# Patient Record
Sex: Male | Born: 1985 | Race: White | Hispanic: No | Marital: Married | Smoking: Former smoker
Health system: Southern US, Community
[De-identification: ages and names within clinical notes are randomized; demographics above are authoritative.]

## PROBLEM LIST (undated history)

## (undated) DIAGNOSIS — K589 Irritable bowel syndrome without diarrhea: Secondary | ICD-10-CM

## (undated) DIAGNOSIS — F1910 Other psychoactive substance abuse, uncomplicated: Secondary | ICD-10-CM

## (undated) DIAGNOSIS — K859 Acute pancreatitis without necrosis or infection, unspecified: Secondary | ICD-10-CM

## (undated) DIAGNOSIS — M19019 Primary osteoarthritis, unspecified shoulder: Secondary | ICD-10-CM

## (undated) DIAGNOSIS — L709 Acne, unspecified: Secondary | ICD-10-CM

## (undated) DIAGNOSIS — F419 Anxiety disorder, unspecified: Secondary | ICD-10-CM

## (undated) DIAGNOSIS — N200 Calculus of kidney: Secondary | ICD-10-CM

## (undated) HISTORY — DX: Anxiety disorder, unspecified: F41.9

## (undated) HISTORY — DX: Calculus of kidney: N20.0

## (undated) HISTORY — DX: Acne, unspecified: L70.9

## (undated) HISTORY — DX: Irritable bowel syndrome, unspecified: K58.9

## (undated) HISTORY — DX: Other psychoactive substance abuse, uncomplicated: F19.10

## (undated) HISTORY — PX: OPEN ANTERIOR SHOULDER RECONSTRUCTION: SHX2100

## (undated) HISTORY — DX: Primary osteoarthritis, unspecified shoulder: M19.019

## (undated) HISTORY — DX: Acute pancreatitis without necrosis or infection, unspecified: K85.90

## (undated) HISTORY — PX: KNEE ARTHROSCOPY: SUR90

## (undated) HISTORY — PX: NEPHROSTOMY TRACT DILATATION W/ LITHOTRIPSY: SHX2080

---
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
## 2006-09-28 ENCOUNTER — Emergency Department (HOSPITAL_COMMUNITY): Admission: EM | Admit: 2006-09-28 | Discharge: 2006-09-28 | Payer: Self-pay | Admitting: Emergency Medicine

## 2006-09-28 ENCOUNTER — Ambulatory Visit: Payer: Self-pay | Admitting: Internal Medicine

## 2006-09-28 ENCOUNTER — Encounter: Payer: Self-pay | Admitting: Internal Medicine

## 2006-09-30 ENCOUNTER — Ambulatory Visit: Payer: Self-pay | Admitting: Internal Medicine

## 2006-10-02 ENCOUNTER — Ambulatory Visit (HOSPITAL_COMMUNITY): Admission: RE | Admit: 2006-10-02 | Discharge: 2006-10-02 | Payer: Self-pay | Admitting: Internal Medicine

## 2006-10-02 ENCOUNTER — Encounter: Payer: Self-pay | Admitting: Internal Medicine

## 2006-10-07 ENCOUNTER — Ambulatory Visit: Payer: Self-pay | Admitting: Internal Medicine

## 2007-01-21 ENCOUNTER — Ambulatory Visit: Payer: Self-pay | Admitting: Internal Medicine

## 2007-02-24 ENCOUNTER — Ambulatory Visit: Payer: Self-pay | Admitting: Internal Medicine

## 2007-04-05 ENCOUNTER — Ambulatory Visit: Payer: Self-pay | Admitting: Internal Medicine

## 2007-04-05 LAB — CONVERTED CEMR LAB
AST: 18 units/L (ref 0–37)
Alkaline Phosphatase: 64 units/L (ref 39–117)
CO2: 32 meq/L (ref 19–32)
Calcium: 10 mg/dL (ref 8.4–10.5)
Eosinophils Relative: 1.1 % (ref 0.0–5.0)
GFR calc non Af Amer: 91 mL/min
Glucose, Bld: 87 mg/dL (ref 70–99)
Hemoglobin: 15.7 g/dL (ref 13.0–17.0)
Lymphocytes Relative: 28.5 % (ref 12.0–46.0)
Monocytes Relative: 7.2 % (ref 3.0–11.0)
Neutro Abs: 5.5 10*3/uL (ref 1.4–7.7)
Platelets: 225 10*3/uL (ref 150–400)
Potassium: 4.3 meq/L (ref 3.5–5.1)
RDW: 11.8 % (ref 11.5–14.6)
Sodium: 143 meq/L (ref 135–145)
Total Protein: 7.2 g/dL (ref 6.0–8.3)
WBC: 8.8 10*3/uL (ref 4.5–10.5)

## 2007-04-12 ENCOUNTER — Ambulatory Visit (HOSPITAL_COMMUNITY): Admission: RE | Admit: 2007-04-12 | Discharge: 2007-04-12 | Payer: Self-pay | Admitting: Urology

## 2007-05-22 ENCOUNTER — Encounter: Payer: Self-pay | Admitting: *Deleted

## 2007-06-08 ENCOUNTER — Ambulatory Visit: Payer: Self-pay | Admitting: Internal Medicine

## 2007-11-02 ENCOUNTER — Ambulatory Visit: Payer: Self-pay | Admitting: Internal Medicine

## 2008-01-03 DIAGNOSIS — N200 Calculus of kidney: Secondary | ICD-10-CM | POA: Insufficient documentation

## 2008-01-03 DIAGNOSIS — K589 Irritable bowel syndrome without diarrhea: Secondary | ICD-10-CM | POA: Insufficient documentation

## 2008-01-03 DIAGNOSIS — K219 Gastro-esophageal reflux disease without esophagitis: Secondary | ICD-10-CM | POA: Insufficient documentation

## 2008-03-10 ENCOUNTER — Ambulatory Visit: Payer: Self-pay | Admitting: Internal Medicine

## 2008-03-10 DIAGNOSIS — R1013 Epigastric pain: Secondary | ICD-10-CM | POA: Insufficient documentation

## 2008-03-10 DIAGNOSIS — R197 Diarrhea, unspecified: Secondary | ICD-10-CM | POA: Insufficient documentation

## 2008-03-13 ENCOUNTER — Encounter (INDEPENDENT_AMBULATORY_CARE_PROVIDER_SITE_OTHER): Payer: Self-pay

## 2008-03-13 LAB — CONVERTED CEMR LAB
Amylase: 47 units/L (ref 27–131)
BUN: 11 mg/dL (ref 6–23)
Basophils Absolute: 0 10*3/uL (ref 0.0–0.1)
CRP, High Sensitivity: <1 — ABNORMAL LOW (ref 0.00–5.00)
Chloride: 103 meq/L (ref 96–112)
Eosinophils Absolute: 0.2 10*3/uL (ref 0.0–0.7)
GFR calc Af Amer: 110 mL/min
Glucose, Bld: 67 mg/dL — ABNORMAL LOW (ref 70–99)
Lipase: 22 units/L (ref 11.0–59.0)
Lymphocytes Relative: 33.6 % (ref 12.0–46.0)
Monocytes Relative: 5.5 % (ref 3.0–12.0)
Neutro Abs: 4.5 10*3/uL (ref 1.4–7.7)
Neutrophils Relative %: 58.3 % (ref 43.0–77.0)
Sodium: 142 meq/L (ref 135–145)

## 2008-04-18 ENCOUNTER — Ambulatory Visit: Payer: Self-pay | Admitting: Internal Medicine

## 2008-04-18 LAB — HM COLONOSCOPY

## 2008-04-19 ENCOUNTER — Telehealth: Payer: Self-pay | Admitting: Internal Medicine

## 2008-06-24 ENCOUNTER — Emergency Department (HOSPITAL_COMMUNITY): Admission: EM | Admit: 2008-06-24 | Discharge: 2008-06-25 | Payer: Self-pay | Admitting: Emergency Medicine

## 2008-06-26 ENCOUNTER — Telehealth (INDEPENDENT_AMBULATORY_CARE_PROVIDER_SITE_OTHER): Payer: Self-pay | Admitting: *Deleted

## 2008-06-26 ENCOUNTER — Ambulatory Visit: Payer: Self-pay | Admitting: Internal Medicine

## 2008-06-26 DIAGNOSIS — R7401 Elevation of levels of liver transaminase levels: Secondary | ICD-10-CM | POA: Insufficient documentation

## 2008-06-26 DIAGNOSIS — R740 Nonspecific elevation of levels of transaminase and lactic acid dehydrogenase [LDH]: Secondary | ICD-10-CM

## 2008-06-27 ENCOUNTER — Encounter: Payer: Self-pay | Admitting: Internal Medicine

## 2008-07-04 ENCOUNTER — Ambulatory Visit (HOSPITAL_COMMUNITY): Admission: RE | Admit: 2008-07-04 | Discharge: 2008-07-04 | Payer: Self-pay | Admitting: Internal Medicine

## 2008-07-11 ENCOUNTER — Telehealth: Payer: Self-pay | Admitting: Internal Medicine

## 2008-07-18 ENCOUNTER — Encounter: Payer: Self-pay | Admitting: Internal Medicine

## 2008-08-09 ENCOUNTER — Ambulatory Visit (HOSPITAL_COMMUNITY): Admission: RE | Admit: 2008-08-09 | Discharge: 2008-08-09 | Payer: Self-pay | Admitting: General Surgery

## 2008-08-09 ENCOUNTER — Encounter: Payer: Self-pay | Admitting: Internal Medicine

## 2008-10-13 ENCOUNTER — Ambulatory Visit: Payer: Self-pay | Admitting: Internal Medicine

## 2008-10-13 LAB — CONVERTED CEMR LAB
AST: 21 units/L (ref 0–37)
Alkaline Phosphatase: 43 units/L (ref 39–117)
IgA: 188 mg/dL (ref 68–378)
Lipase: 27 units/L (ref 11.0–59.0)
Tissue Transglutaminase Ab, IgA: 0.3 units (ref <7)
Total Bilirubin: 0.8 mg/dL (ref 0.3–1.2)

## 2008-10-18 ENCOUNTER — Telehealth: Payer: Self-pay | Admitting: Internal Medicine

## 2008-10-18 ENCOUNTER — Ambulatory Visit: Payer: Self-pay | Admitting: Internal Medicine

## 2008-10-29 ENCOUNTER — Encounter: Payer: Self-pay | Admitting: Internal Medicine

## 2008-10-30 ENCOUNTER — Telehealth: Payer: Self-pay | Admitting: Internal Medicine

## 2008-10-31 ENCOUNTER — Encounter: Payer: Self-pay | Admitting: Gastroenterology

## 2008-10-31 ENCOUNTER — Encounter: Payer: Self-pay | Admitting: Internal Medicine

## 2008-11-13 ENCOUNTER — Telehealth: Payer: Self-pay | Admitting: Internal Medicine

## 2008-11-14 ENCOUNTER — Telehealth: Payer: Self-pay | Admitting: Physician Assistant

## 2008-11-16 ENCOUNTER — Ambulatory Visit (HOSPITAL_COMMUNITY): Admission: RE | Admit: 2008-11-16 | Discharge: 2008-11-16 | Payer: Self-pay | Admitting: Gastroenterology

## 2008-11-16 ENCOUNTER — Ambulatory Visit: Payer: Self-pay | Admitting: Gastroenterology

## 2008-11-27 ENCOUNTER — Telehealth: Payer: Self-pay | Admitting: Internal Medicine

## 2008-11-27 ENCOUNTER — Encounter (INDEPENDENT_AMBULATORY_CARE_PROVIDER_SITE_OTHER): Payer: Self-pay

## 2008-12-11 ENCOUNTER — Ambulatory Visit: Payer: Self-pay | Admitting: Internal Medicine

## 2008-12-19 ENCOUNTER — Encounter: Payer: Self-pay | Admitting: Cardiology

## 2008-12-19 ENCOUNTER — Ambulatory Visit: Payer: Self-pay | Admitting: Cardiology

## 2009-01-23 ENCOUNTER — Telehealth: Payer: Self-pay | Admitting: Internal Medicine

## 2009-03-16 ENCOUNTER — Telehealth: Payer: Self-pay | Admitting: Internal Medicine

## 2009-03-19 ENCOUNTER — Encounter: Payer: Self-pay | Admitting: Internal Medicine

## 2009-04-19 ENCOUNTER — Telehealth: Payer: Self-pay | Admitting: Internal Medicine

## 2009-07-18 ENCOUNTER — Telehealth (INDEPENDENT_AMBULATORY_CARE_PROVIDER_SITE_OTHER): Payer: Self-pay | Admitting: *Deleted

## 2009-07-19 ENCOUNTER — Telehealth: Payer: Self-pay | Admitting: Internal Medicine

## 2009-07-22 ENCOUNTER — Encounter: Payer: Self-pay | Admitting: Internal Medicine

## 2009-10-30 ENCOUNTER — Ambulatory Visit: Payer: Self-pay | Admitting: Internal Medicine

## 2009-10-30 DIAGNOSIS — R030 Elevated blood-pressure reading, without diagnosis of hypertension: Secondary | ICD-10-CM | POA: Insufficient documentation

## 2009-10-30 DIAGNOSIS — R635 Abnormal weight gain: Secondary | ICD-10-CM | POA: Insufficient documentation

## 2009-11-01 ENCOUNTER — Ambulatory Visit: Payer: Self-pay | Admitting: Internal Medicine

## 2009-11-23 ENCOUNTER — Ambulatory Visit: Payer: Self-pay | Admitting: Internal Medicine

## 2009-12-08 ENCOUNTER — Emergency Department (HOSPITAL_BASED_OUTPATIENT_CLINIC_OR_DEPARTMENT_OTHER): Admission: EM | Admit: 2009-12-08 | Discharge: 2009-12-08 | Payer: Self-pay | Admitting: Emergency Medicine

## 2009-12-08 ENCOUNTER — Ambulatory Visit: Payer: Self-pay | Admitting: Diagnostic Radiology

## 2009-12-28 ENCOUNTER — Telehealth: Payer: Self-pay | Admitting: Internal Medicine

## 2009-12-28 ENCOUNTER — Encounter: Payer: Self-pay | Admitting: Internal Medicine

## 2010-02-12 HISTORY — PX: SHOULDER SURGERY: SHX246

## 2010-04-12 ENCOUNTER — Ambulatory Visit: Payer: Self-pay | Admitting: Internal Medicine

## 2010-04-12 DIAGNOSIS — K5289 Other specified noninfective gastroenteritis and colitis: Secondary | ICD-10-CM | POA: Insufficient documentation

## 2010-04-12 LAB — CONVERTED CEMR LAB
Alkaline Phosphatase: 47 units/L (ref 39–117)
Basophils Absolute: 0 10*3/uL (ref 0.0–0.1)
CO2: 26 meq/L (ref 19–32)
Calcium: 10 mg/dL (ref 8.4–10.5)
Chloride: 110 meq/L (ref 96–112)
GFR calc non Af Amer: 95.05 mL/min (ref >60)
HCT: 42.8 % (ref 39.0–52.0)
Hemoglobin: 14.9 g/dL (ref 13.0–17.0)
Lymphocytes Relative: 22.9 % (ref 12.0–46.0)
Lymphs Abs: 1.9 10*3/uL (ref 0.7–4.0)
Potassium: 4.3 meq/L (ref 3.5–5.1)
RBC: 4.58 M/uL (ref 4.22–5.81)
Total Bilirubin: 0.6 mg/dL (ref 0.3–1.2)
Total Protein: 7.8 g/dL (ref 6.0–8.3)

## 2010-04-18 ENCOUNTER — Ambulatory Visit: Payer: Self-pay | Admitting: Internal Medicine

## 2010-04-18 ENCOUNTER — Telehealth: Payer: Self-pay | Admitting: Internal Medicine

## 2010-04-19 ENCOUNTER — Ambulatory Visit: Payer: Self-pay | Admitting: Internal Medicine

## 2010-04-22 ENCOUNTER — Telehealth: Payer: Self-pay | Admitting: Internal Medicine

## 2010-05-09 ENCOUNTER — Telehealth: Payer: Self-pay | Admitting: Internal Medicine

## 2010-05-10 ENCOUNTER — Ambulatory Visit: Payer: Self-pay | Admitting: Gastroenterology

## 2010-05-10 DIAGNOSIS — R109 Unspecified abdominal pain: Secondary | ICD-10-CM | POA: Insufficient documentation

## 2010-05-10 DIAGNOSIS — K6289 Other specified diseases of anus and rectum: Secondary | ICD-10-CM | POA: Insufficient documentation

## 2010-05-10 DIAGNOSIS — K625 Hemorrhage of anus and rectum: Secondary | ICD-10-CM | POA: Insufficient documentation

## 2010-05-10 DIAGNOSIS — R634 Abnormal weight loss: Secondary | ICD-10-CM | POA: Insufficient documentation

## 2010-05-13 ENCOUNTER — Ambulatory Visit: Payer: Self-pay | Admitting: Cardiovascular Disease

## 2010-05-15 LAB — CONVERTED CEMR LAB
BUN: 12 mg/dL (ref 6–23)
Basophils Relative: 0.8 % (ref 0.0–3.0)
CRP, High Sensitivity: 1.76 (ref 0.00–5.00)
Creatinine, Ser: 0.9 mg/dL (ref 0.4–1.5)
Eosinophils Relative: 3 % (ref 0.0–5.0)
GFR calc non Af Amer: 113.91 mL/min (ref >60)
Glucose, Bld: 88 mg/dL (ref 70–99)
HCT: 42.2 % (ref 39.0–52.0)
Hemoglobin: 14.8 g/dL (ref 13.0–17.0)
Lymphocytes Relative: 36.1 % (ref 12.0–46.0)
RBC: 4.49 M/uL (ref 4.22–5.81)
RDW: 12.4 % (ref 11.5–14.6)
Sed Rate: 6 mm/hr (ref 0–22)
Sodium: 143 meq/L (ref 135–145)
WBC: 7.2 10*3/uL (ref 4.5–10.5)

## 2010-07-30 ENCOUNTER — Telehealth: Payer: Self-pay | Admitting: Internal Medicine

## 2010-09-20 ENCOUNTER — Encounter: Payer: Self-pay | Admitting: Internal Medicine

## 2010-09-26 ENCOUNTER — Encounter: Payer: Self-pay | Admitting: Internal Medicine

## 2010-11-05 NOTE — Assessment & Plan Note (Signed)
Summary: FOLLOW UP MEDS--CH.    History of Present Illness Visit Type: Follow-up Visit Primary GI MD: Stan Head MD [facility] Primary Provider: Illene Regulus, M.D. Requesting Provider: NA Chief Complaint: IBS, abdominal pain and ? medication side effects History of Present Illness:   Patinet has alot of questions about the side effects of the Nortriptyline. He also would like to find an alt. drug that does the same thing because he wants to get into law enforcement and can not be on any antidepressants. He is also having some epigastric pain. The side effects from the medication include rapid heart reate, weight gain, and he feels more aggresive.   Epigastric pain this week. feels good and then bad at times. taking daily Advil for upper abdominal tightness, not as severe as in past (this is different since nortriptylline)  BP taken at home 130-140/90-100 for diastolic Resting pulse 80-85. Pulse up to 155 when exercising. No chest pain or pre-syncope described.  Never took nortriptylline for 50 mg   GI Review of Systems    Reports abdominal pain.     Location of  Abdominal pain: epigastric area.    Denies acid reflux, belching, bloating, chest pain, dysphagia with liquids, dysphagia with solids, heartburn, loss of appetite, nausea, vomiting, vomiting blood, weight loss, and  weight gain.        Denies anal fissure, black tarry stools, change in bowel habit, constipation, diarrhea, diverticulosis, fecal incontinence, heme positive stool, hemorrhoids, irritable bowel syndrome, jaundice, light color stool, liver problems, rectal bleeding, and  rectal pain. Preventive Screening-Counseling & Management  Alcohol-Tobacco     Smoking Status: quit     Smoking Cessation Counseling: yes    Current Medications (verified): 1)  Nortriptyline Hcl 25 Mg Caps (Nortriptyline Hcl) .... Take One By Mouth At Bedtime 2)  Bentyl 20 Mg Tabs (Dicyclomine Hcl) .... Take One By Mouth Once Daily  Allergies  (verified): 1)  ! * Cefzil 2)  Morphine Sulfate (Morphine Sulfate)  Past History:  Past Surgical History: Last updated: 12/11/2008 reconstruction right shoulder for subluxation  Dr. Eulah Pont lithotripsy for nephrolithiasis Knee Arthroscopy(rt)  Family History: Last updated: 03/10/2008  Neg- colon cancer, CAD, DM Family History of Pancreatic Cancer: Family History of Colitis/Crohn's:mother  Social History: Last updated: 10/30/2009 HSG some college work, in school at [employer], working towards Patent examiner unemployed since 8/10, living off financial aid Alcohol Use - no Daily Caffeine Use Patient is a former smoker, age [age], quit 2010 Dips snuff inermittently Patient has been counseled to quit all tobacco  Social History: HSG some college work, in school at [employer], working towards Patent examiner unemployed since 8/10, living off financial aid Alcohol Use - no Daily Caffeine Use Patient is a former smoker, age [age], quit 2010 Dips snuff inermittently Patient has been counseled to quit all tobacco Smoking Status:  quit  Review of Systems       The patient complains of arthritis/joint pain, fatigue, headaches-new, muscle pains/cramps, shortness of breath, sleeping problems, and thirst - excessive.    Vital Signs:  Patient profile:   25 year old male Height:      70 inches Weight:      213 pounds BMI:     30.67 Pulse rate:   104 / minute Pulse rhythm:   regular BP sitting:   130 / 90  (left arm) Cuff size:   regular  Vitals Entered By: Harlow Mares CMA Duncan Dull) (October 30, 2009 11:00 AM)  Physical Exam  General:  Well developed, well nourished, no acute distress. overweight Eyes:  anicteric Lungs:  Clear throughout to auscultation. Heart:  Regular rate and rhythm; no murmurs, rubs,  or bruits. Abdomen:  mildly tender epigastrium, worse with muscle tenson otherwise sft and nontender w/o HSM, mass Psych:  Alert and cooperative. Normal mood  and affect.   Impression & Recommendations:  Problem # 1:  IBS (ICD-564.1) Assessment Improved this has responded well to nortriptylline it seems but ? side effects Overall the best he has been in some time so not sure what therapy I would offer if he stops TCA will ask him to see Dr. Debby Bud to evaluate re: possible nortriptylline side effects he maty benefit from tertiary center IBS evaluation also await Dr. Debby Bud' assessment before changes  Problem # 2:  EPIGASTRIC PAIN (ICD-789.06) Assessment: Deteriorated ? musculoskeletal advised to stop sit ups and will see how he is  Problem # 3:  INCREASED BLOOD PRESSURE (ICD-796.2) Assessment: New ? wgt increase, meds causing he will see Dr. Debby Bud I advised him to record BP's for Dr. Debby Bud to see  Problem # 4:  ENCOUNTER FOR LONG-TERM USE TRICYCLICS (ICD-V58.69) Assessment: Comment Only ? side effects of this vs. weight gain from smoking cessation and increased weight leading to BP increase, tachycardia with exercise, etc he has gained 40# since 3/10  Problem # 5:  WEIGHT GAIN (ICD-783.1) Assessment: New 40# increase since 3/10 he has stopped smoking and been on nortriptylline  Patient Instructions: 1)  Please stop any exercises that strain the abdominal wall. 2)  Please schedule a follow up with Dr. Debby Bud to discuss your elevated blood pressure and possible medication side effects. 3)  Copy sent to : Illene Regulus, MD 4)  The medication list was reviewed and reconciled.  All changed / newly prescribed medications were explained.  A complete medication list was provided to the patient / caregiver.

## 2010-11-05 NOTE — Letter (Signed)
Summary: Email from patient/[facility]  Email from patient/[facility]   Imported By: Lester Bristol 01/30/2010 09:54:14  _____________________________________________________________________  External Attachment:    Type:   Image     Comment:   External Document

## 2010-11-05 NOTE — Progress Notes (Signed)
  Phone Note Call from Patient Call back at [facility] Phone [phone]   Summary of Call: Patient is requesting rx for gen zoloft 50mg . OK?  Initial call taken by: Lamar Sprinkles, CMA,  July 30, 2010 9:40 AM  Follow-up for Phone Call        OK for sertraline 50,g  sig 1 by mouth once daily , #30  refill as needed  Follow-up by: Jacques Navy MD,  July 30, 2010 12:45 PM  Additional Follow-up for Phone Call Additional follow up Details #1::        Patient notified and rx sent.Alvy Beal Archie CMA  July 30, 2010 2:11 PM     New/Updated Medications: SERTRALINE HCL 50 MG TABS (SERTRALINE HCL) Take 1 tablet by mouth once a day Prescriptions: SERTRALINE HCL 50 MG TABS (SERTRALINE HCL) Take 1 tablet by mouth once a day  #30 x 11   Entered by:   Rock Nephew CMA   Authorized by:   Jacques Navy MD   Signed by:   Rock Nephew CMA on 07/30/2010   Method used:   Electronically to        [id number]* (retail)       [address]       [place], [place]  [address]       Ph: [phone]       Fax: [phone]   RxID:   [id number]

## 2010-11-05 NOTE — Progress Notes (Signed)
  Phone Note Outgoing Call   Reason for Call: Discuss lab or test results Summary of Call: Please call patient - stools studies were negative. None the less complete the flagy prescription  THNAKS Initial call taken by: Jacques Navy MD,  April 22, 2010 4:53 AM  Follow-up for Phone Call        spoke with pt and informed him. He states he is feeling much better, but has developed some blood in his urine. what do you advise for pt? Follow-up by: Ami Bullins CMA,  April 22, 2010 11:19 AM  Additional Follow-up for Phone Call Additional follow up Details #1::        con't flagyl. If he continues to have blood in the urine he will need f/u OV Additional Follow-up by: Jacques Navy MD,  April 22, 2010 11:58 AM    Additional Follow-up for Phone Call Additional follow up Details #2::    informed pt Follow-up by: Ami Bullins CMA,  April 22, 2010 2:11 PM

## 2010-11-05 NOTE — Progress Notes (Signed)
Summary: DIARRHEA  Phone Note Call from Patient   Summary of Call: Pt was seen by Dr Jonny Ruiz for stomach "virus". Feels better this week but continues to c/o diarrhea daily. The lomotil has not help at all w/diarrhea. left mess to call office back for more details Initial call taken by: Lamar Sprinkles, CMA,  April 18, 2010 3:00 PM  Follow-up for Phone Call        Spoke w/pt, Lomotil gave some relief this weekend. Starting monday pt has had 5 to 6 loose watery stools. Thinks the red he sees is from hemorrhoids and not in the stool itself. No fever but pt consistently has been taking tylenol. Lomotil has not given relief.  MD informed, office visit and stool studies needed. Scheduled for tomorrow, lab order in [facility] Follow-up by: Lamar Sprinkles, CMA,  April 18, 2010 3:15 PM

## 2010-11-05 NOTE — Assessment & Plan Note (Signed)
Summary: DISCUSS NEW RX WITH MD-LB   Vital Signs:  Patient profile:   25 year old male Height:      70 inches Weight:      210 pounds BMI:     30.24 O2 Sat:      98 % on Room air Temp:     98.2 degrees F oral Pulse rate:   76 / minute BP sitting:   128 / 82  (left arm) Cuff size:   regular  Vitals Entered By: Bill Salinas CMA (November 23, 2009 4:48 PM)  O2 Flow:  Room air CC: pt here to discuss buspirone/ ab   Primary Care Provider:  Illene Regulus, M.D.  CC:  pt here to discuss buspirone/ ab.  History of Present Illness: Patient with severe IBS who has had trouble with adjunct medications: intolerant of desipramine, intolerant of wellbutrin though it helped the IBS. HE has tried several SSRI products in the past to which he is intolerant.   Current Medications (verified): 1)  Bentyl 20 Mg Tabs (Dicyclomine Hcl) .... Take One By Mouth Once Daily 2)  Buspirone Hcl 15 Mg Tabs (Buspirone Hcl) .... 1/2 Tab Two Times A Day X 5, 2/3 Tab Two Times A Day X 5 Days Then 1 Tab Bid  Allergies (verified): 1)  ! * Cefzil 2)  Morphine Sulfate (Morphine Sulfate) PMH-FH-SH reviewed-no changes except otherwise noted  Review of Systems  The patient denies anorexia, fever, weight loss, weight gain, vision loss, decreased hearing, hoarseness, chest pain, syncope, dyspnea on exertion, peripheral edema, prolonged cough, headaches, hemoptysis, melena, hematochezia, severe indigestion/heartburn, hematuria, incontinence, genital sores, muscle weakness, suspicious skin lesions, transient blindness, difficulty walking, depression, unusual weight change, abnormal bleeding, enlarged lymph nodes, angioedema, and testicular masses.    Physical Exam  General:  Well-developed,well-nourished,in no acute distress; alert,appropriate and cooperative throughout examination Lungs:  normal respiratory effort.   Heart:  normal rate and regular rhythm.   Psych:  Oriented X3, memory intact for recent and remote,  normally interactive, and good eye contact.     Impression & Recommendations:  Problem # 1:  IBS (ICD-564.1) persistent IBS. Will now try benzodiazepine as adjunct having been intolerant of many products. Did discuss the risks of habituation, somnolence with chronic use of this class of drug.  Plan- lorazepam 0.5 mg two times a day or three times a day for IBS symptoms.  Complete Medication List: 1)  Bentyl 20 Mg Tabs (Dicyclomine hcl) .... Take one by mouth once daily 2)  Lorazepam 0.5 Mg Tabs (Lorazepam) .Marland Kitchen.. 1 by mouth two times a day or three times a day for anxiety Prescriptions: LORAZEPAM 0.5 MG TABS (LORAZEPAM) 1 by mouth two times a day or three times a day for anxiety  #90 x 1   Entered and Authorized by:   Jacques Navy MD   Signed by:   Jacques Navy MD on 11/23/2009   Method used:   Print then Give to Patient   RxID:   [id number]

## 2010-11-05 NOTE — Procedures (Signed)
Summary: [facility] COLON   Colonoscopy  Procedure date:  04/18/2008  Findings:      Location:  [facility].   Patient Name: Ronald Daniel, Ronald Daniel. MRN: [id number] Procedure Procedures: Colonoscopy CPT: [phone].  Personnel: Endoscopist: Iva Boop, MD, [facility].  Exam Location: Exam performed in Outpatient Clinic. Outpatient  Patient Consent: Procedure, Alternatives, Risks and Benefits discussed, consent obtained, from patient. Consent was obtained by the RN.  Indications Symptoms: Diarrhea Abdominal pain / bloating. Rectal bleeding.  History  Current Medications: Patient is not currently taking Coumadin.  Allergies: Allergic to Cefzime, Morphine.  Comments: Diarrhea and abdomnal pain (intermittent), some rectal bleeding. Mom has Crohn's. Recent CBC, CMET, amylase/lipase ok Pre-Exam Physical: Performed Apr 18, 2008. Cardio-pulmonary exam, Rectal exam, HEENT exam , Abdominal exam, Mental status exam WNL.  Comments: Pt. history reviewed/updated, physical exam performed prior to initiation of sedation? YES Exam Exam: Extent of exam reached: Cecum, extent intended: Cecum.  The cecum was identified by appendiceal orifice and IC valve. Patient position: on left side. Time to Cecum: 00:02:34. Time for Withdrawl: 00:08:04. Colon retroflexion performed. Images taken. ASA Classification: II. Tolerance: fair, adequate exam.  Monitoring: Pulse and BP monitoring, Oximetry used. Supplemental O2 given.  Colon Prep Used MiraLax for colon prep. Prep results: good.  Sedation Meds: Patient assessed and found to be appropriate for moderate (conscious) sedation. Fentanyl 75 mcg. given IV. Versed 8 mg. given IV. Benadryl 25 given IV.  Findings NORMAL EXAM: Terminal Ileum.  - NORMAL EXAM: Cecum to Rectum. Comments: some hypertophied anal papillae but no active hemorrhoids.   Assessment  Comments: 1) NORMAL COLONOSCOPY AND TERMINAL ILEOSCOPY EXAM 2) THERE WAS AN IBS RESONSE TO SCOPE  PASSAGE 3) GOOD PREP Events  Unplanned Interventions: No intervention was required.  Plans Comments: HE SAYS DICYCLOMINE IS HELPING, ONLY 1 SPELL OF EPIGASTRIC CRAMPING SINCE LAST OFFICE VISIT 03/10/08 CONTINUE THAT Disposition: After procedure patient sent to recovery. After recovery patient sent home.  Scheduling/Referral: Clinic Visit, to Iva Boop, MD, [facility], 4-6 WEEKS,    cc: The Patient  This report was created from the original endoscopy report, which was reviewed and signed by the above listed endoscopist.

## 2010-11-05 NOTE — Progress Notes (Signed)
Summary: Triage   Phone Note Call from Patient Call back at Home Phone [phone]   Caller: Patient Call For: Dr. Leone Payor Reason for Call: Talk to Nurse Summary of Call: pt feels like he has a fissure and it is bleeding Initial call taken by: Karna Christmas,  May 09, 2010 3:26 PM  Follow-up for Phone Call        patient c/o rectal pain, irritation, bleeding. Patient  has been using OTC products with no relief.  Patient  advised to try sitz bath two times a day and continue OTC prep H and come in and see Mike Gip PA tomorrow at 11:00 Follow-up by: Darcey Nora RN, CGRN,  May 09, 2010 3:39 PM

## 2010-11-05 NOTE — Assessment & Plan Note (Signed)
Summary: diarrhea/SD   Primary Care Provider:  Illene Regulus, M.D.   History of Present Illness: ! week history of diarrhea with 5-6 loose stools daily. Denies out of town travel, new foods, new meds  prior to on-set. He has not noticed any blood or mucus in the stool.  Did have one day of bad night sweats but no documented fevers. He has had cramping and abdominal discomfort.  He did see Dr. Jonny Ruiz last week. Working diagnosis was gastroenteritis. Lab including CBC, Metabolic, LFTs were normal. He was treated with lomotil which intially worked well but now is not helping at all. Stools studies ordered yesterday and pateint brought specimens today.   Allergies: 1)  ! * Cefzil 2)  Morphine Sulfate (Morphine Sulfate) PMH-FH-SH reviewed-no changes except otherwise noted  Review of Systems       The patient complains of anorexia, fever, and weight loss.  The patient denies weight gain, hoarseness, chest pain, dyspnea on exertion, prolonged cough, abdominal pain, severe indigestion/heartburn, genital sores, transient blindness, difficulty walking, unusual weight change, and angioedema.         weight loss of maybe 10- lbs.  Physical Exam  General:  Tall WWD whie male in no distress Head:  Normocephalic and atraumatic without obvious abnormalities. No apparent alopecia or balding. Eyes:  C&S clear withou icterus Lungs:  normal respiratory effort.   Heart:  normal rate and regular rhythm.   Abdomen:  soft, non-tender, normal bowel sounds, no distention, no rebound tenderness, and no hepatomegaly.   Skin:  turgor normal and color normal.     Impression & Recommendations:  Problem # 1:  DIARRHEA, ACUTE, CHRONIC (ICD-787.91) Patient with IBS and concommitant bowel changes now with acute diarrhea. On taking history he did have 5 days of antibiotics at the time of his shoulder surgery raising the possibility of C.Diff.  Plan - metronidazole 500mg  three times a day for 10 days           stool  samples submitted.  His updated medication list for this problem includes:    Lomotil 2.5-0.025 Mg Tabs (Diphenoxylate-atropine) .Marland Kitchen... 1po as needed loose stool (max 8 tabs per 24 hrs)  Addendum - stool negative for C. Diff or pathogens.  Complete Medication List: 1)  Bentyl 20 Mg Tabs (Dicyclomine hcl) .... Take 1 tablet every 6 hours as needed or before meals 2)  Lorazepam 0.5 Mg Tabs (Lorazepam) .Marland Kitchen.. 1 by mouth two times a day or three times a day for anxiety 3)  Promethazine Hcl 25 Mg Tabs (Promethazine hcl) .Marland Kitchen.. 1po q 6 hrs as needed nausea 4)  Lomotil 2.5-0.025 Mg Tabs (Diphenoxylate-atropine) .Marland Kitchen.. 1po as needed loose stool (max 8 tabs per 24 hrs) 5)  Metronidazole 500 Mg Tabs (Metronidazole) .Marland Kitchen.. 1 by mouth three times a day x 7 days 6)  Sertraline Hcl 25 Mg Tabs (Sertraline hcl) .Marland Kitchen.. 1 by mouth once daily Prescriptions: SERTRALINE HCL 25 MG TABS (SERTRALINE HCL) 1 by mouth once daily  #30 x 5   Entered and Authorized by:   Jacques Navy MD   Signed by:   Jacques Navy MD on 04/19/2010   Method used:   Electronically to        [facility] (retail)       [address]       [place], [place]  [address]       Ph: [phone] or [phone]  Fax: [phone]   RxID:   [id number] METRONIDAZOLE 500 MG TABS (METRONIDAZOLE) 1 by mouth three times a day x 7 days  #21 x 1   Entered and Authorized by:   Jacques Navy MD   Signed by:   Jacques Navy MD on 04/19/2010   Method used:   Electronically to        [facility] (retail)       [address]       [place], [place]  [address]       Ph: [phone] or [phone]       Fax: [phone]   RxID:   [id number]

## 2010-11-05 NOTE — Assessment & Plan Note (Signed)
Summary: meds/#/cd   Vital Signs:  Patient profile:   25 year old male Height:      70 inches Weight:      212 pounds BMI:     30.53 O2 Sat:      97 % on Room air Temp:     98.0 degrees F oral Pulse rate:   86 / minute BP sitting:   122 / 72 Cuff size:   regular  Vitals Entered ByMorrie Sheldon Johnson(November 01, 2009 2:06 PM)  O2 Flow:  Room air CC: Pt here discuss possible med changes as recommended by GI doctor/aj   Primary Care Provider:  Illene Regulus, M.D.  CC:  Pt here discuss possible med changes as recommended by GI doctor/aj.  History of Present Illness: patient presents to discus medication change. He has been on nortiptyline for IBS with excellent results in controlling his symptoms but he is having significant weight gain and sleep disrutption. 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 He has otherwise been well.   Current Medications (verified): 1)  Nortriptyline Hcl 25 Mg Caps (Nortriptyline Hcl) .... Take One By Mouth At Bedtime 2)  Bentyl 20 Mg Tabs (Dicyclomine Hcl) .... Take One By Mouth Once Daily  Allergies (verified): 1)  ! * Cefzil 2)  Morphine Sulfate (Morphine Sulfate) PMH-FH-SH reviewed-no changes except otherwise noted  Review of Systems       The patient complains of weight gain.  The patient denies anorexia, fever, chest pain, syncope, dyspnea on exertion, headaches, abdominal pain, hematochezia, muscle weakness, difficulty walking, depression, and enlarged lymph nodes.    Physical Exam  General:  WNWD white male in no distress. He has gained 40+ lbs but is carrying it well. Head:  Normocephalic and atraumatic without obvious abnormalities. No apparent alopecia or balding. Lungs:  Normal respiratory effort, chest expands symmetrically. Lungs are clear to auscultation, no crackles or wheezes. Heart:  Normal rate and regular rhythm. S1 and S2 normal without gallop, murmur, click, rub or other extra  sounds. Neurologic:  alert & oriented X3 and cranial nerves II-XII intact.   Skin:  turgor normal, color normal, and no rashes.   Psych:  Oriented X3, memory intact for recent and remote, normally interactive, and good eye contact.     Impression & Recommendations:  Problem # 1:  IBS (ICD-564.1) IBS is much  better with TCA but side effects of weight gain and increased BP and mild mood change. The core issue seems to be anxiety as a causative factor.  Plan start buspirone 7.5mg  two times a day x 5, 10mg  two times a day x 5 then 15 mg two times a day and recheck in 10 additional days         continue nortriptyline for the first 7 days then stop          f/u appt 20 days.   Complete Medication List: 1)  Nortriptyline Hcl 25 Mg Caps (Nortriptyline hcl) .... Take one by mouth at bedtime 2)  Bentyl 20 Mg Tabs (Dicyclomine hcl) .... Take one by mouth once daily 3)  Buspirone Hcl 15 Mg Tabs (Buspirone hcl) .... 1/2 tab two times a day x 5, 2/3 tab two times a day x 5 days then 1 tab bid  Patient Instructions: 1)  IBS - considering that the main driver for symptoms may be anxiety but that the nortriptyline may be contributing to weight gain, irritability and hypertension.  Plan is to try a non-benzodiazepine anxiolytic - buspirone: 7.5 mg two times a day x 5, 10mg  two times a day x 5 the 15 mg two times a day with a follow up appointment in 20 days. Congtinue the nortriptyline for the first week then stop.  Prescriptions: BUSPIRONE HCL 15 MG TABS (BUSPIRONE HCL) 1/2 tab two times a day x 5, 2/3 tab two times a day x 5 days then 1 tab bid  #60 x 1   Entered and Authorized by:   Jacques Navy MD   Signed by:   Bill Salinas CMA on 11/01/2009   Method used:   Electronically to        [facility] (retail)       [address]       [place], [place]  [address]       Ph: [phone] or [phone]       Fax: [phone]   RxID:   [id number]

## 2010-11-05 NOTE — Assessment & Plan Note (Signed)
Summary: rectal irritation/bleeding? fissure/pain/sheri    History of Present Illness Visit Type: Follow-up Visit Primary GI MD: Stan Head MD [facility] Primary Provider: Illene Regulus, M.D. Requesting Provider: NA Chief Complaint: rectal irriation bleeding and diarrhea 4 times per day History of Present Illness:   Ronald Daniel 24 YO MALE KNOWN TO DR GESSNER WITH HX OF IBS,ANXIETY. HE HAD A COLONOSCOPY IN 2009 WHICH WAS NEGATIVE. HE HAS TYPICALLY HAD ABDOMINAL PAIN,CRAMPING AND TENDENCY TO DIARRHEA WITH HIS IBS WHEN IT BOTHERS HIM. AT THIS TIME HE STARTED WITH DIARRHEA IN JULY-IT HAS BEEN PERSISTENT,WORSE THAN HE USUALLY HAS AND MUCH LONGER DURATION. HE REORTS ABDCRAMPING -TAKING BENTYL REGULARLY. HE WAS SEEN IN PRIMARY CARE- HAD STOOL STUDIES DONE-ALL NEGATIVE, AND WAS GIVEN A COURSE OF FLAGYL X 10 DAYS WITH NO IMPROVEMENT. HE HAS BEEN EATING LESS AND HAS LOST 20 POUNDS OVER THE PAST COUPLE MONTHS.  HE RECENTLY STARTED ON ZOLOFT BUT ALL OF THE SXS PRECEEDED THE ZOLOFT.  NOW WITH RECTAL PAIN,BLEEDING, X 3-4 DAYS  WITH  FIRM SWELLING AT ANUS. STOOLS CURRENTLY LOOSE 3-4 PER DAY. PT'S MOTHER WITH CROHNS DISEASE.   GI Review of Systems    Reports abdominal pain and  weight loss.     Location of  Abdominal pain: lower abdomen. Weight loss of 20 POUNDS pounds over .   Denies acid reflux, belching, bloating, chest pain, dysphagia with liquids, dysphagia with solids, heartburn, loss of appetite, nausea, vomiting, and  vomiting blood.      Reports anal fissure,change in bowel habits, diarrhea, irritable bowel syndrome, rectal bleeding, and  rectal pain.     Denies black tarry stools, light color stool, and  liver problems.    Current Medications (verified): 1)  Bentyl 20 Mg Tabs (Dicyclomine Hcl) .... Take 1 Tablet Every 6 Hours As Needed or Before Meals 2)  Lorazepam 0.5 Mg Tabs (Lorazepam) .Marland Kitchen.. 1 By Mouth Two Times A Day or Three Times A Day For Anxiety 3)  Promethazine Hcl 25 Mg Tabs  (Promethazine Hcl) .Marland Kitchen.. 1po Q 6 Hrs As Needed Nausea 4)  Lomotil 2.5-0.025 Mg Tabs (Diphenoxylate-Atropine) .Marland Kitchen.. 1po As Needed Loose Stool (Max 8 Tabs Per 24 Hrs) 5)  Sertraline Hcl 25 Mg Tabs (Sertraline Hcl) .Marland Kitchen.. 1 By Mouth Once Daily  Allergies (verified): 1)  ! 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 Eulah Pont lithotripsy for nephrolithiasis Knee Arthroscopy(rt) Lt. Shoulder Surgery 02/12/10  Family History: Reviewed history from 03/10/2008 and no changes required.  Neg- colon cancer, CAD, DM Family History of Pancreatic Cancer: Family History of Colitis/Crohn's:mother  Social History: Reviewed history from 10/30/2009 and no changes required. HSG some college work, in school at [employer], working towards Patent examiner unemployed since 8/10, living off financial aid Alcohol Use - no Daily Caffeine Use Patient is a former smoker, age [age], quit 2010 Dips snuff inermittently Patient has been counseled to quit all tobacco  Review of Systems       The patient complains of sleeping problems.  The patient denies allergy/sinus, anemia, anxiety-new, arthritis/joint pain, back pain, blood in urine, breast changes/lumps, change in vision, confusion, cough, coughing up blood, depression-new, fainting, fatigue, fever, headaches-new, hearing problems, heart murmur, heart rhythm changes, itching, muscle pains/cramps, night sweats, nosebleeds, shortness of breath, skin rash, sore throat,  swelling of feet/legs, swollen lymph glands, thirst - excessive, urination - excessive, urination changes/pain, urine leakage, vision changes, and voice change.         SEE HPI  Vital Signs:  Patient profile:    25 year old male Height:      70 inches Weight:      195.50 pounds BMI:     28.15 Pulse rate:   88 / minute Pulse rhythm:   regular BP sitting:   132 / 78  (left arm)  Vitals Entered By: Milford Cage NCMA (May 10, 2010 11:03 AM)  Physical Exam  General:  Well developed, well nourished, no acute distress. Head:  Normocephalic and atraumatic. Eyes:  PERRLA, no icterus. Lungs:  Clear throughout to auscultation. Heart:  Regular rate and rhythm; no murmurs, rubs,  or bruits. Abdomen:  SOFT, TENDER RLQ/RMQ   AND LLQ, NO MASS OR HSM,BS+ Rectal:  INDURATED AREA ON RIGHT AT ANUS EXQUISITLEY TENDER,OOZING SMALL AMT OF BLOOD,IRREGULAR FEELING ANAL CANAL,EXAM LIMITED DUE TO PAIN,NO DEFINITE PERIRECTAL MASS OR INDURATION. Extremities:  No clubbing, cyanosis, edema or deformities noted. Neurologic:  Alert and  oriented x4;  grossly normal neurologically. Psych:  Alert and cooperative. Normal mood and affect.anxious.     Impression & Recommendations:  Problem # 1:  RECTAL PAIN (ICD-569.42) Assessment New 24 YO MALE WITH DX OF IBS-WITH  6 WEEK HX OF PERSISTENT DIARRHEA, ABDOMINAL CRAMPING,WEIGHT LOSS OF 20 POUNDS, AND NOW WITH 3-4 DAYS OF RECTAL PAIN,BLEEDING.  THIS MAY ALL BE DUE TO IBS/ ANXIETY / AND A PARTIALLY THROMBOSED HEMORRHOID, HOWEVER GIVEN CHANGE IN SXS ,WEIGHT LOSS AND FAMILY HX OF CROHNS-HE NEEDS FURTHER WORK UP.  CANNOT  ADEQUATELY EXAM RECTUM DUE TO ANAL AREA DUE TO PAIN.  CHECK LABS AS BELOW SCHEDULE FOR CT ABD/PELVIS TO ASSESS ? CROHNS,R/O PERIRECTAL INVOLVEMENT. CONTINUE BENTYL 10 MG Q 6 HOURS AS NEEDED CONTINUE SITZ BATHS TWICE DAILY START ANAMANTLE GEL 3-4 X DAILY  PT ADVISED TO CALL IF PAIN WORSENS OR HE DEVELOPS FEVER. MAY NEED REPEAT COLON EVAL PENDING ABOVE. Orders: TLB-BMP (Basic Metabolic Panel-BMET) (80048-METABOL) TLB-CRP-High Sensitivity (C-Reactive Protein) (86140-FCRP) TLB-CBC Platelet - w/Differential (85025-CBCD) TLB-Sedimentation Rate (ESR)  (85652-ESR) CT Abdomen/Pelvis with Contrast (CT Abd/Pelvis w/con)  Problem # 2:  WEIGHT LOSS-ABNORMAL (ICD-783.21) Assessment: Comment Only  Problem # 3:  NEPHROLITHIASIS, HX OF (ICD-V13.01) Assessment: Comment Only  Patient Instructions: 1)  We scheduled the CT scan for Mon 05-13-2010.  2)  Directions and contrast given. 3)  Continue the Sitz baths. 4)  We sent a prescription for Anamantle to your pharmacy 5)  Copy sent to : Illene Regulus, MD 6)  Continue the Bentyl  every 6 hours and Lomotil as needed. 7)  The medication list was reviewed and reconciled.  All changed / newly prescribed medications were explained.  A complete medication list was provided to the patient / caregiver. Prescriptions: LIDOCAINE-HYDROCORTISONE ACE 2.8-0.55 % GEL (LIDOCAINE-HYDROCORTISONE ACE) Use rectally 3-4 times daily for rectal pain  #1 tube x 0   Entered by:   Lowry Ram NCMA   Authorized by:   Sammuel Cooper PA-c   Signed by:   Lowry Ram NCMA on 05/10/2010   Method used:   Telephoned to ...       [id number]* (retail)       [address]       [place], [place]  [address]       Ph: [phone] or [phone]       Fax:  [phone]   RxID:   [id number] ANAMANTLE HC 3-0.5 % CREA (LIDOCAINE-HYDROCORTISONE ACE) Use rectally 3-4 times daily  #30 cc x 0   Entered by:   Lowry Ram NCMA   Authorized by:   Sammuel Cooper PA-c   Signed by:   Lowry Ram NCMA on 05/10/2010   Method used:   Electronically to        [facility]* (retail)       [address]       [place], [place]  [address]       Ph: [phone] or [phone]       Fax: [phone]   RxID:   [id number]  [facility] did not have the Anamantle HC cream.  We called [facility] for the Lidocaine-Hydrotisone ACE Gel, 1 Tube with 15 applicators.  Pt informed to pick it up at [facility]

## 2010-11-05 NOTE — Progress Notes (Signed)
Summary: triage   Phone Note Call from Patient Call back at Home Phone [phone]   Caller: Patient Call For: Ronald Daniel Reason for Call: Talk to Nurse Summary of Call: Patient has had two bms and they had been tarty black and has lower bad pain, wants to know what to do  Initial call taken by: Tawni Levy,  December 28, 2009 2:39 PM  Follow-up for Phone Call        Spoke with patient he had 2 BM's today that were black, he does report that he took Peptobimol for his IBS last night.  I have asked him to call back if he has continued black stools off of Pepto.  Patient  will call back for continued problems. Follow-up by: Darcey Nora RN, CGRN,  December 28, 2009 3:04 PM

## 2010-11-05 NOTE — Assessment & Plan Note (Signed)
Summary: NAUSEA/VOMITING/NOT FEELING WELL/LB   Vital Signs:  Patient profile:   25 year old male Height:      70 inches Weight:      196 pounds BMI:     28.22 O2 Sat:      98 % on Room air Temp:     97.9 degrees F oral Pulse rate:   92 / minute BP sitting:   118 / 82  (left arm) Cuff size:   regular  Vitals Entered By: Bill Salinas CMA (April 12, 2010 12:09 PM)  O2 Flow:  Room air CC: pt here with c/o of nausea, vomitting and diarrhea x 4 days/ ab   Primary Care Provider:  Illene Regulus, M.D.  CC:  pt here with c/o of nausea and vomitting and diarrhea x 4 days/ ab.  History of Present Illness: here with 4 days onset fever, general weakness, malaise, nausea (and several episodes vomiting) , crampy abd pains and watery diarrhea, without chills, blood, orthostasis, falls or confusion. No rash, joint pains, and OTC meds not helping.  Girlfriend works at [employer] and had milder similar illness recent.  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 Take 1 Tablet Every 6 Hours As Needed or Before Meals 2)  Lorazepam 0.5 Mg Tabs (Lorazepam) .Marland Kitchen.. 1 By Mouth Two Times A Day or Three Times A Day For Anxiety  Current Medications (verified): 1)  Bentyl 20 Mg Tabs (Dicyclomine Hcl) .... Take 1 Tablet Every 6 Hours As Needed or Before Meals 2)  Lorazepam 0.5 Mg Tabs (Lorazepam) .Marland Kitchen.. 1 By Mouth Two Times A Day or  Three Times A Day For Anxiety 3)  Promethazine Hcl 25 Mg Tabs (Promethazine Hcl) .Marland Kitchen.. 1po Q 6 Hrs As Needed Nausea 4)  Lomotil 2.5-0.025 Mg Tabs (Diphenoxylate-Atropine) .Marland Kitchen.. 1po As Needed Loose Stool (Max 8 Tabs Per 24 Hrs)  Allergies (verified): 1)  ! * Cefzil 2)  Morphine Sulfate (Morphine Sulfate)  Past History:  Past Medical History: Last updated: 06/26/2008 Usual Childhood Disease Acne DJD shoulders w/ h/o subluxation h/o substance abuse, amphetamines and opiates, resolved '05 h/o pancreatitis '07 IBS  Past Surgical History: Last updated: 12/11/2008 reconstruction right shoulder for subluxation  Dr. Eulah Pont lithotripsy for nephrolithiasis Knee Arthroscopy(rt)  Social History: Last updated: 10/30/2009 HSG some college work, in school at [employer], working towards Patent examiner unemployed since 8/10, living off financial aid Alcohol Use - no Daily Caffeine Use Patient is a former smoker, age [age], quit 2010 Dips snuff inermittently Patient has been counseled to quit all tobacco  Risk Factors: Smoking Status: quit (10/30/2009) Packs/Day: 1/2 pack qd  (05/22/2007) Cans of tobacco/wk: yes (03/10/2008)  Review of Systems       all otherwise negative per pt -    Physical Exam  General:  alert and overweight-appearing.  , mild ill Head:  normocephalic and atraumatic.   Eyes:  vision grossly intact, pupils  equal, and pupils round.   Ears:  bilat tm's mild erythema, sinus nontender Nose:  nasal dischargemucosal pallor and mucosal edema.   Mouth:  pharyngeal erythema and fair dentition.   Neck:  supple and no masses.   Lungs:  normal respiratory effort and normal breath sounds.   Heart:  normal rate and regular rhythm.   Abdomen:  soft and normal bowel sounds. with diffuse mild tender without guarding or rebound Msk:  no joint tenderness and no joint swelling.   Extremities:  no edema, no erythema  Skin:  no rashes.     Impression &  Recommendations:  Problem # 1:  GASTROENTERITIS, ACUTE (ICD-558.9) c/w prob viral illness, ok to tx with phenergan, lomotil as needed, check labs to check for leukocytosis, f/u any worsening symptoms Orders: TLB-BMP (Basic Metabolic Panel-BMET) (80048-METABOL) TLB-CBC Platelet - w/Differential (85025-CBCD) TLB-Hepatic/Liver Function Pnl (80076-HEPATIC)  Complete Medication List: 1)  Bentyl 20 Mg Tabs (Dicyclomine hcl) .... Take 1 tablet every 6 hours as needed or before meals 2)  Lorazepam 0.5 Mg Tabs (Lorazepam) .Marland Kitchen.. 1 by mouth two times a day or three times a day for anxiety 3)  Promethazine Hcl 25 Mg Tabs (Promethazine hcl) .Marland Kitchen.. 1po q 6 hrs as needed nausea 4)  Lomotil 2.5-0.025 Mg Tabs (Diphenoxylate-atropine) .Marland Kitchen.. 1po as needed loose stool (max 8 tabs per 24 hrs)  Patient Instructions: 1)  Please take all new medications as prescribed 2)  Continue all previous medications as before this visit  3)  Please go to the Lab in the basement for your blood and/or urine tests today  4)  if the WBC are elevated we may need to send in antibiotic later 5)  Please schedule a follow-up appointment as needed. Prescriptions: LOMOTIL 2.5-0.025 MG TABS (DIPHENOXYLATE-ATROPINE) 1po as needed loose stool (max 8 tabs per 24 hrs)  #40 x 1   Entered and Authorized by:   Corwin Levins MD   Signed by:   Corwin Levins MD on 04/12/2010   Method used:   Print then Give to Patient   RxID:   [id number] PROMETHAZINE HCL 25 MG TABS (PROMETHAZINE HCL) 1po q 6 hrs as needed nausea  #40 x 1   Entered and Authorized by:   Corwin Levins MD   Signed by:   Corwin Levins MD on 04/12/2010   Method used:   Print then Give to Patient   RxID:   [id number]

## 2010-11-07 NOTE — Letter (Signed)
[facility]   Imported By: Sherian Rein 10/02/2010 12:49:31  _____________________________________________________________________  External Attachment:    Type:   Image     Comment:   External Document

## 2010-11-07 NOTE — Letter (Signed)
Summary: [facility]   Imported By: Lennie Odor 10/08/2010 13:47:46  _____________________________________________________________________  External Attachment:    Type:   Image     Comment:   External Document

## 2011-02-18 NOTE — Assessment & Plan Note (Signed)
[facility]                         GASTROENTEROLOGY OFFICE NOTE   OLDEN, KLAUER                          MRN:          [id number]  DATE:06/08/2007                            DOB:          04/27/1986    CHIEF COMPLAINT:  Followup on irritable bowel and reflux.   When he can get Aciphex, that controls his reflux well.  He is still  dipping some snuff and is cautioned not to do so.  Minimal caffeine.  Dicyclomine has helped his abdominal pain.  He has been using some of  his mother's supply.  He has had some cost issues, he is behind in some  bills, so that is a problem, and he has run out of his Aciphex.  His  weight is stable.  No fevers, no bleeding.  If he eats spicy foods, he  will have diarrhea, but otherwise his bowel habits alternate somewhat.   PHYSICAL EXAMINATION:  Weight 173 pounds.  Pulse 88, blood pressure  122/84.   PROBLEMS:  See previous lists on Feb 24, 2007 and April 05, 2007.   ASSESSMENT:  1. Gastroesophageal reflux disease which is controlled with Aciphex.  2. Irritable bowel syndrome, alternator tendency towards diarrhea.      Helps by dicyclomine.  He feels like life is okay if he is on that      and watches his diet.   PLAN:  Continue the dicyclomine and the Aciphex.  Return to see me as  needed.  Keep in mind that his mother has Crohn's disease.  I do not see  any good reason to perform any endoscopic evaluation on him at this  point, but I did explain to him that sometimes symptoms of irritable  bowel syndrome could progress, and it could be early Crohn's disease, so  I do not think so at this point.  Laboratory workup has been reassuring  in the past as well.     Iva Boop, MD,FACG  Electronically Signed    CEG/MedQ  DD: 06/08/2007  DT: 06/08/2007  Job #: [id number]

## 2011-02-18 NOTE — Assessment & Plan Note (Signed)
[facility]                         GASTROENTEROLOGY OFFICE NOTE   WINFREY, CHILLEMI                          MRN:          [id number]  DATE:04/05/2007                            DOB:          Oct 22, 1985    CHIEF COMPLAINT:  Follow-up of abdominal pain, change in bowels.   See my note of Feb 24, 2007 for details of his past medical history.   He was tried on AcipHex which seems to have helped some, but he has run  out of it.  He says he left some in the car as well and thinks it  overheated.  He is better than before but still has some lower quadrant  abdominal pain.  He has increased gastrocolic reflex.  He is not on  Accutane anymore.  He has not lost weight.  He went to [place] for  vacation and said that he was having quite a bit of trouble with food  passing right through him with moving his bowels every time he ate.  He  has not had vomiting.   PHYSICAL EXAMINATION:  VITAL SIGNS:  Weight 174 pounds, pulse 72, blood  pressure 112/72.  ABDOMEN:  Soft, nontender, without organomegaly or mass.   Note:  He is due to have a kidney stone treated with what I think is  extracorporal lithotripsy soon.  That is next week.   ASSESSMENT:  1. Possible reflux.  2. Abdominal pain, some change in bowel habits.  I think he probably      has irritable bowel syndrome, though inflammatory bowel disease is      in the differential plan labs.  He has had abnormal transaminases      in the past (ultrasound negative, gallbladder ejection fraction      borderline), will recheck his LFTs, check a BMET, CBC with      differential, and a sedimentation rate.  3. Continue AcipHex.  Samples and prescription given 20 mg daily every      morning.  4. Dicyclomine 10 mg before meals.  See if that does not help.  5. Return to see me in about eight weeks, sooner if needed.  I have      held off on any invasive investigations such as sigmoidoscopy or      colonoscopy or  upper endoscopy at this time.  Depending upon the      clinical course those could be indicated.     Iva Boop, MD,FACG  Electronically Signed    CEG/MedQ  DD: 04/05/2007  DT: 04/05/2007  Job #: [id number]   cc:   Rosalyn Gess. Norins, MD

## 2011-02-18 NOTE — Assessment & Plan Note (Signed)
[facility]                         GASTROENTEROLOGY OFFICE NOTE   OREE, HISLOP                          MRN:          [id number]  DATE:02/24/2007                            DOB:          07/13/1986    REFERRING PHYSICIAN:  Rosalyn Gess. Norins, MD   REASON FOR CONSULTATION:  Gastrointestinal problems, abdominal pain,  history of questionable pancreatitis.  A 25 year old white man that had  some abdominal pain off and on with a severe spell in December 2007.  We  thought maybe he had pancreatitis although his amylase and lipase were  normal.  He had some mild elevation of LFTs with AST 44, ALT 110.  He  had ultrasound, unrevealing, and a mildly decreased gallbladder ejection  fraction at 43% but a normal HIDA otherwise.   He has had some intermittent upper gastric and periumbilical discomfort,  particularly after eating and some diarrhea.  His mother either has  irritable bowel or Crohn's disease and his father died from pancreatic  cancer at age 42.   RECOMMENDATIONS AND PLAN:  At this point, this sounds like it could be  gastroesophageal reflux disease or functional dyspepsia.  His ER labs  from December showed a normal CBC, CMET, amylase and lipase.  His  pancreas looked normal on his ultrasound.  He is minimally tender in the  right lower quadrant just to the right of the umbilicus now.   PLAN:  1. Trial of Zegerid 40 mg daily.  2. Return in 1 month for reassessment.  If there is incomplete or no      benefit to this, then further investigation would need to be done      with repeat labs, (we need to repeat his LFTs at some point      anyway), other imaging which could include CT scanning and perhaps      even a colonoscopy or upper endoscopy.  He is young and seems      vigorous.  He has not lost weight, so we have both decided upon an      empiric trial of acid blockade at this time.   HISTORY:  A 20 year white man with problems as  above.  See my medical  history form.  There is mild anorexia at times it seems like.  A year  ago he saw some blood on the tissue paper with a hard stool but there is  no rectal bleeding.  His history sort of evolves as I talked to him and  more came out as time went on.  He has this post prandial epigastric and  supraumbilical pressure and indigestion and burning pain, worse with  fatty, greasy and some spicy foods.  He tries to avoid those.  He is not  describing weight loss, fever or other constitutional symptoms.   MEDICATIONS:  He has been on Accutane (this can cause pancreatitis,  though he did not have an elevated amylase or lipase and he is advised  to hold this.  He has not really been taking it.)   ALLERGIES:  To CEFZIL.  ADDITIONAL PAST MEDICAL HISTORY:  Includes addiction issues.  Usual  childhood illnesses and some bronchitis.  There is a history of oral  amphetamine and opioid abuse.  He did not mention that today but I see  that in Dr. Debby Bud notes.  He went through rehab and there is also a  history of binge drinking in the past.   SOCIAL HISTORY:  He is a resident, Naval architect.  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  He chews tobacco and is counseled to  quit.  No smoking, no alcohol, no drug use.   REVIEW OF SYSTEMS:  Some urinary difficulty at times.   All other systems negative.   PHYSICAL EXAMINATION:  Height 6 feet, weight 171 pounds.  Blood pressure  120/80.  Pulse 64.  Eyes anicteric.  ENT: Normal mouth and oropharynx.  NECK:  Supple. No thyromegaly or mass.  CHEST:  Clear.  HEART:  Normal S1 and S2.  ABDOMEN:  Is soft.  Minimally to mildly tender in the right lower  quadrant as described above.  No masses, no organomegaly.  Inspection the anus shows no perianal changes.  Digital rectal exam is  deferred at this time.  EXTREMITIES:  No edema.  SKIN:  Slight  acne on the face.  PSYCH:  He is alert and oriented x3.   I have reviewed Dr. Debby Bud' notes, the ER records from his visit and the  other notes in the [facility] chart including the labs and the x-ray  reports.   I appreciate the opportunity to care for this patient.     Iva Boop, MD,FACG  Electronically Signed    CEG/MedQ  DD: 02/24/2007  DT: 02/24/2007  Job #: [id number]   cc:   Rosalyn Gess. Norins, MD

## 2011-02-19 ENCOUNTER — Ambulatory Visit (INDEPENDENT_AMBULATORY_CARE_PROVIDER_SITE_OTHER): Payer: PRIVATE HEALTH INSURANCE | Admitting: Internal Medicine

## 2011-02-19 ENCOUNTER — Encounter: Payer: Self-pay | Admitting: Internal Medicine

## 2011-02-19 VITALS — BP 104/72 | HR 55 | Temp 97.7°F | Wt 214.0 lb

## 2011-02-19 DIAGNOSIS — F411 Generalized anxiety disorder: Secondary | ICD-10-CM

## 2011-02-19 MED ORDER — LORAZEPAM 0.5 MG PO TABS: 0.5000 mg | ORAL_TABLET | Freq: Three times a day (TID) | ORAL | Status: DC | PRN

## 2011-02-19 MED ORDER — SERTRALINE HCL 100 MG PO TABS: 100.0000 mg | ORAL_TABLET | Freq: Every day | ORAL | Status: DC

## 2011-02-20 DIAGNOSIS — F411 Generalized anxiety disorder: Secondary | ICD-10-CM | POA: Insufficient documentation

## 2011-02-20 NOTE — Progress Notes (Signed)
Subjective:    Patient ID: Ronald Daniel, male    DOB: 07-Jun-1986, 25 y.o.   MRN: [id number]  HPI Ronald Daniel has been followed for chronic anxiety disorder. He has been taking zoloft 50mg  daily. He reports that his anxiety and anxiety attacks are worse and not as well controlled by zoloft 50mg  as it had been. He is interested in a modification in his regimen.  Past Medical History  Diagnosis Date  . Acne   . DJD of shoulder     with h/o sublaxation  . Substance abuse   . Pancreatitis   . IBS (irritable bowel syndrome)   . Chronic anxiety    Past Surgical History  Procedure Date  . Open anterior shoulder reconstruction     for sublaxation  . Nephrostomy tract dilatation w/ lithotripsy     for nephrolithiasis  . Knee arthroscopy     Right  . Shoulder surgery 02/12/2010    Left   Family History  Problem Relation Age of Onset  . Pancreatic cancer    . Colitis    . Crohn's disease Mother    History   Social History  . Marital Status: Single    Spouse Name: N/A    Number of Children: N/A  . Years of Education: N/A   Occupational History  . Unemployed; Student    Social History Main Topics  . Smoking status: Former Smoker    Types: Cigarettes    Quit date: 10/06/2008  . Smokeless tobacco: Current User    Types: Snuff   Comment: Age [age]  . Alcohol Use: No  . Drug Use: Not on file  . Sexually Active: Not on file   Other Topics Concern  . Not on file   Social History Narrative   HSGSome college work, in school at [employer], working towards Social worker enforcementUnemployed since 08/2010Alcohol use - noDaily caffeine usePatient is a former smoker, age [age], quit 2010Dips snuff intermittentlyPatient has been counseled to quit all tobaccoUsual Childhood Diseases        Review of Systems Review of Systems  Constitutional:  Negative for fever, chills, activity change and unexpected weight change.  HENT:  Negative for hearing loss, ear pain, congestion, neck stiffness  and postnasal drip.   Eyes: Negative for pain, discharge and visual disturbance.  Respiratory: Negative for chest tightness and wheezing.   Cardiovascular: Negative for chest pain and palpitations.       [No decreased exercise tolerance Gastrointestinal: [No change in bowel habit. No bloating or gas. No reflux or indigestion Genitourinary: Negative for urgency, frequency, flank pain and difficulty urinating.  Musculoskeletal: Negative for myalgias, back pain, arthralgias and gait problem.  Neurological: Negative for dizziness, tremors, weakness and headaches.  Hematological: Negative for adenopathy.  Psychiatric/Behavioral: Positive for anxiety, negative for dysphoric mood.       Objective:   Physical Exam Vitals reviewed WNWD White male in no distress HEENT- nl Chest - unlabored respirations at a normal rate Cor- RRR without tachycardia Psych- calm, thoughts are well organized. No acute issues as we speak.       Assessment & Plan:  1. Anxiety disorder - patient is not well controlled on present regimen  Plan - increase zoloft to 100mg  daily           Lorazepam 0.25 mg q 4 prn for break-though. He is advised that if he needs break-through medication more than a couple of times a week he will need an adjustment in his maintenance medication.

## 2011-02-21 NOTE — Letter (Signed)
January 21, 2007    Valetta Fuller, M.D.  [address]  [place], [place] [address]   RE:  [name], [name]  MRN:  [id number]  /  DOB:  [birth year]   Dear Onalee Hua:   Thank you very much for seeing Ronald Daniel, a very pleasant 25 year old  gentleman who was found to have a left renal calculus measuring 8 x 6 x  6 mL.  This was found on abdominal ultrasound performed September 28, 2006 when he was being worked up for atypical pancreatitis. The  patient's Past Medical History is significant for the atypical  pancreatitis as noted.  He also has kidney stone as noted.  He has had  reconstruction and repair of his right shoulder for chronic subluxation.  His medical history includes chickenpox as a child, acne for which he  has been treated with  Minocin and Accutane.  He does have degenerative  changes in both shoulders including the minor subluxation as noted.  He  has had episodes of bronchitis treated four times a year over the past  several years.  The patient has had knee surgery in the past  arthroscopically on the right.  The patient also had an early history of  some substance abuse problems from which he has made a full recovery.   I am seeing the patient today actually for an episode of near syncope  which I believe is related to either a viral gastroenteritis versus a  case of mild prostatitis with bilateral lower abdominal pain and  discomfort, peroneal pain and discomfort, low back pain and discomfort,  dysuria, urinary frequency.  Ronald Daniel declined to have a prostate exam so I  do not really have a definitive diagnosis, but I am treating him with  Cipro 500 mg b.i.d. for 10 days based on his clinical presentation.   FAMILY HISTORY:  Family history is significant for his father having  died at an early age from lymphoma.  Mother is in good health except for  some chronic back problems.   SOCIAL HISTORY:  The patient is a high Garment/textile technologist.  He has been  working at the [employer].   CURRENT MEDICATIONS:  Accutane only.   Ab had actually been very asymptomatic in regards to this kidney stone  or at least I believe he has been asymptomatic.  However, it is rather  large and too large to pass, and certainly given his young age it might  be worth having you evaluate him as to whether he is going to need any  additional intervention or treatment.  If I can provide any additional  information in regards to his Past Medical History, please do not  hesitate to contact me.  As always, I appreciate your assistance in the  care of my patient as well as looking forward to hearing from you.    Sincerely,      Rosalyn Gess. Norins, MD  Electronically Signed    MEN/MedQ  DD: 01/21/2007  DT: 01/21/2007  Job #: [id number]

## 2011-02-21 NOTE — Op Note (Signed)
[place]  Patient:    Ronald Daniel, Ronald Daniel                          MRN: [id number] Proc. Date: 05/20/01 Adm. 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  OPERATION PERFORMED:  Right shoulder examination under anesthesia with restabilization utilizing posterior capsular shift.  SURGEON:  Loreta Ave, M.D.  ASSISTANT:  Arlys John D. Petrarca, P.A.-C.  ANESTHESIA:  General.  ESTIMATED BLOOD LOSS: Minimal.  SPECIMENS:  None.  CULTURES:  None.  COMPLICATIONS:  None.  DRESSING:  Soft compressive with an external brace holding the arm in abduction and external rotation.  DESCRIPTION OF PROCEDURE:  The patient was brought to the operating room and placed on the operating table in supine position.  After adequate anesthesia had been obtained, turned to a lateral position right side up.  Prepped and draped in the usual sterile fashion.  Both shoulders examined before being positioned confirming marked instability on the right.  Straight incision from the posterolateral tip of the acromion extending down along the shoulder joint line posteriorly.  Skin and subcutaneous tissue divided.  Hemostasis obtained with electrocautery.  The posterior raphe within the deltoid was then sharply opened exposing the underlying external rotators.  The interval between the infraspinatus and teres minor developed, bluntly dissected and then retracted with a self-retaining retractor put in place.  With this, the entire posterior capsule was exposed and neurovascular structures protected.  The capsule was then cut in a T-d manner from the 12 to 6 oclock position at the glenoid attachment and then from the glenoid to the humerus at the  3 oclock position. Wound irrigated.  Shoulder itself inspected with marked laxity but no other findings in the joint.  Inferior leaflet then brought up to the 12 oclock position and the superior leaflet down to the 6 oclock position after sufficient mobilization and protecting the neurovascular structures.  Both leaflets were then sewn in place with multiple #2 nonabsorbable Ethibond sutures and the overlapping of the leaflet fibers was reinforced with suture. Shoulder tested for stability which was felt to be excellent for the stability we are trying to achieve.  Arm was left in a slightly abducted and external rotated position to protect the repair.  The infraspinatus and supraspinatus were allowed to close.  The deltoid was closed with ____________ Vicryl and the incision closed with subcutaneous and subcuticular Vicryl.  Margins of the wound injected with  Marcaine.  Steri-Strips applied.  Sterile compressive dressing and the external orthosis brace applied.  Anesthesia reversed. Brought to recovery room.  Tolerated surgery well.  No complications. DD:  05/20/01 TD:  05/20/01 Job: [id number]

## 2011-02-21 NOTE — Op Note (Signed)
Ronald Daniel, Ronald Daniel                   ACCOUNT NO.:  [id number]   MEDICAL RECORD NO.:  [id number]          PATIENT TYPE:  AMB   LOCATION:  DSC                          FACILITY:  [facility]   PHYSICIAN:  Loreta Ave, M.D. DATE OF BIRTH:  07/05/1986   DATE OF PROCEDURE:  04/16/2005  DATE OF DISCHARGE:                                 OPERATIVE REPORT   PREOPERATIVE DIAGNOSIS:  Right knee lateral patellofemoral dislocation with  torn medial patellofemoral ligament and osteochondral injury lateral femoral  condyle.   POSTOPERATIVE DIAGNOSIS:  Right knee lateral patellofemoral dislocation with  torn medial patellofemoral ligament and osteochondral injury lateral femoral  condyle.   OPERATION PERFORMED:  Right knee examination under anesthesia, arthroscopy,  removal of traumatic osteochondral loose bodies from the margin of the  lateral femoral condyle.  Chondroplasty of that area as well as inferior  patella.  Open repair medial patellofemoral ligament with nonabsorbable  suture.   SURGEON:  Loreta Ave, M.D.   ASSISTANT:  Genene Churn. Denton Meek.   ANESTHESIA:  General.   ESTIMATED BLOOD LOSS:  Minimal.   TOURNIQUET TIME:  One hour.   SPECIMENS:  None.   CULTURES:  None.   COMPLICATIONS:  None.   DRESSING:  Soft compressive with knee immobilizer.   DESCRIPTION OF PROCEDURE:  The patient was brought to the operating room and  placed on the operating table in supine position.  After adequate anesthesia  had been obtained, both knees examined.  Both had full motion, stable  ligaments except for the medial patellofemoral ligament on the right.  He  has a very shallow trochlea so he had subluxable patellas on both sides in  full extension.  The left, however, is stable in flexion, whereas the right  is just about dislocatable even at 90 degrees of flexion, confirming  significant soft tissue injury to the medial side of the patella.  No  lateral tethering.  Tourniquet applied  to right leg.  Leg prepped and draped  in the usual sterile fashion.  Exsanguinated with elevation Esmarch.  Tourniquet inflated to 300 mmHg.  Medial, anterolateral and parapatellar  portals.  Arthroscope introduced, knee inspected.  Confirmation of lateral  patellofemoral positioning and obvious tear midportion, patellofemoral  ligament up towards the patella and not off the femur.  Does not tether and  able to be reduced but sits lateral because of the soft tissue injury.  Remaining structures examined.  Very small osteochondral injury inferior  pole of the patella debrided.  A larger more than 1 cm diameter  osteochondral fragment found in the lateral gutter, emanating from the  lateral margin of the lateral femoral condyle, fortunately not communicating  to the lateral compartment. All loose bodies removed and surfaces debrided.  Cruciate ligaments, medial and lateral meniscus intact.  All recesses  examined to be sure we had all loose fragments remove.  Instruments and  fluid removed.  Vertical incision medial aspect of the patella.  Skin and  subcutaneous tissue divided.  The vastus medialis taken down with fascia  exposing the medial patellofemoral ligament  and its injury.  Carefully  identified and dissected out freely.  Good attachment of soft tissue to the  patella and to the femur so I could do a primary soft tissue repair.  Soft  tissue repair end-to-end with a little bit of reefing to snug up the  patellofemoral ligament accomplished with numerous interrupted #2 Ethibond  sutures.  Once that was completed, I had marked improvement in stability,  still able to flex better than 90 degrees without excessive tightness, had a  nicely balanced patellofemoral joint.  VMO was then repaired over top of the  ligament repair, again with nonabsorbable suture.  When that was complete,  knee examined. Full motion, not excessively tight but markedly improved  stability. Arthroscope  reintroduced. Tracking assessed, found to be  excellent.  No need for lateral release.  Marked stability of the medial  structures and good patellofemoral tracking. No other abnormality seen.  Instruments and fluid removed.  Portals of the knee and incision injected  with Marcaine.  Portals were closed with nylon.  Incision closed with  subcutaneous and subcuticular Vicryl.  Sterile compressive dressing applied.  Tourniquet deflated and removed.  Knee immobilizer applied.  Anesthesia  reversed.  Brought to recovery room.  Tolerated surgery well.  No  complications.       DFM/MEDQ  D:  04/16/2005  T:  04/16/2005  Job:  [id number]

## 2011-02-21 NOTE — Assessment & Plan Note (Signed)
[facility]                           PRIMARY CARE OFFICE NOTE   Ronald Daniel                          MRN:          [id number]  DATE:01/21/2007                            DOB:          [birth year]    The patient is a 25 year old gentleman who I have seen on multiple  occasions, most recently in January.  He had an episode of pancreatitis  with elevated amylase and lipase.  His evaluation at that time included  an abdominal ultrasound which was unremarkable for cholelithiasis or  cholecystitis.  He eventually had a biliary scan performed on October 02, 2006, which revealed patent cystic ducts without evidence of acute  cholecystitis.  Mildly decreased gallbladder ejection fraction at 43.5%.  The concern was that the patient may have had acalculous cholecystitis  with mild transient obstruction of the common bile duct leading into  pancreatitis.  His symptoms eventually resolved and his amylase and  lipase returned to normal.  His last laboratory from September 30, 2006,  actually with an SGOT of 44, SGPT of 110, amylase at that point was 54.  The patient reports he would continue to have intermittent episodes of  mild epigastric pain and discomfort of a similar nature but not as  intense which seemed to resolve spontaneously and had not required any  medical attention or intervention.   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  By his report, he could hear people talking around  him and therefore never had a full loss of consciousness.  He  subsequently came around and made a full recovery.  The patient reports  that for several days prior to this exam he had been having abdominal  discomfort that is a churning-type discomfort with nausea but no emesis.  No change in  bowel habits.  This is definitely different from his prior  episodes of pancreatitis.  He reports he has continuing to have some  mild symptoms.   CURRENT MEDICATIONS:  Accutane daily.   REVIEW OF SYSTEMS:  Negative except for the HPI.   EXAMINATION:  VITAL SIGNS:  Temperature 97.  Blood pressure 102/68.  Pulse 66.  Weight 170.  GENERAL:  This is a well-nourished, athletic-appearing gentleman in no  acute distress.  HEENT EXAM:  Unremarkable.  NECK:  Supple.  There is no thyromegaly.  No lymphadenopathy was noted  in the cervical or supraclavicular regions.  CHEST:  No CVA tenderness.  LUNGS:  Clear.  CARDIOVASCULAR:  Radial pulse 2+.  Precordium was quiet.  He had a  regular bradycardia which I counted out at 60 beats per minute with no  murmurs, rubs, or gallops.  ABDOMEN:  With positive bowel sounds in all four quadrants.  He had mild  tenderness to deep palpation in the right upper quadrant but not up  under the rib cage.  He also has significant tenderness to deep  palpation in the  lower abdominal quadrants as well suprapubically.  The  patient declined a prostate exam.   ASSESSMENT AND PLAN:  1. Near syncope.  Suspect the patient had a vasovagal episode related      to either a viral GI syndrome with the nausea, decreased appetite,      and discomfort versus prostatitis given that the patient does have      lower quadrant abdominal pain, he does admit to perineal      discomfort, has had dysuria, and also has low back pain.  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  He will be treated with Ciprofloxacin 500      mg b.i.d. for 10 days for possible prostatitis, although this is a      less definitive diagnosis in the absence of a prostate exam and a      mid-stream urine.  2. Gastrointestinal.  The patient with a history of pancreatitis back      in December as noted.  The patient has made a good recovery  but      does have intermittent episodes of discomfort that are similar in      nature.  No clear cut etiology was determined for his rise in      amylase, lipase, and liver function.  The patient will be referred      to Dr. Stan Head for consultation in regards to the need for any      further evaluation and also to have Dr. Marvell Fuller services      available should he have a recurrent episode of pancreatitis.  3. GU - patient with nephrolithiasis by ultrasound 12/07 with calculi      right 8x6x9mm. Pt is referred to Dr. Isabel Caprice for follow-up.     Ronald Gess Norins, MD  Electronically Signed    MEN/MedQ  DD: 01/21/2007  DT: 01/21/2007  Job #: [id number]   cc:   Ronald Daniel

## 2011-03-28 ENCOUNTER — Encounter: Payer: Self-pay | Admitting: Internal Medicine

## 2011-03-28 ENCOUNTER — Ambulatory Visit (INDEPENDENT_AMBULATORY_CARE_PROVIDER_SITE_OTHER): Payer: PRIVATE HEALTH INSURANCE | Admitting: Internal Medicine

## 2011-03-28 ENCOUNTER — Telehealth: Payer: Self-pay | Admitting: *Deleted

## 2011-03-28 VITALS — BP 138/90 | HR 78 | Temp 97.0°F | Wt 210.0 lb

## 2011-03-28 DIAGNOSIS — T148 Other injury of unspecified body region: Secondary | ICD-10-CM

## 2011-03-28 DIAGNOSIS — W57XXXA Bitten or stung by nonvenomous insect and other nonvenomous arthropods, initial encounter: Secondary | ICD-10-CM

## 2011-03-28 DIAGNOSIS — T148XXA Other injury of unspecified body region, initial encounter: Secondary | ICD-10-CM

## 2011-03-28 MED ORDER — DOXYCYCLINE HYCLATE 100 MG PO TABS: 100.0000 mg | ORAL_TABLET | Freq: Two times a day (BID) | ORAL | Status: AC

## 2011-03-28 MED ORDER — SERTRALINE HCL 100 MG PO TABS: 100.0000 mg | ORAL_TABLET | Freq: Every day | ORAL | Status: DC

## 2011-03-28 NOTE — Telephone Encounter (Signed)
Pt c/o rash in area that he was bit by tick 1 wk ago. Advised OV for eval - scheduled for apt at 11:30 today

## 2011-03-28 NOTE — Patient Instructions (Signed)
Tick bite - there is irritation from scratching. Maybe minor infection. Plan - doxycycline 100mg  twice a day for 7 days for any skin infection and to treat for any potential RMSF. For itching - apply ice-pak; take claritine 10mg  once a day and zantac 150mg  twice a day

## 2011-03-30 NOTE — Progress Notes (Signed)
  Subjective:    Patient ID: Ronald Daniel, male    DOB: 26-Jul-1986, 25 y.o.   MRN: [id number]  HPI Ronald Daniel presents after tick bite for evaluation of rash on torso. He has had no fever, headache, palmer or plantar petechial rash. He has been feeling OK.  PMH, FamHx and SocHx reviewed for any changes and relevance.     Review of Systems Review of Systems  Constitutional:  Negative for fever, chills, activity change and unexpected weight change.  HEENT:  Negative for hearing loss, ear pain, congestion, neck stiffness and postnasal drip. Negative for sore throat or swallowing problems. Negative for dental complaints.   Eyes: Negative for vision loss or change in visual acuity.  Respiratory: Negative for chest tightness and wheezing.   Cardiovascular: Negative for chest pain and palpitationNo decreased exercise tolerance Gastrointestinal: No change in bowel habit. No bloating or gas. No reflux or indigestion Genitourinary: Negative for urgency, frequency, flank pain and difficulty urinating.  Musculoskeletal: Negative for myalgias, back pain, arthralgias and gait problem.  Neurological: Negative for dizziness, tremors, weakness and headaches.  Hematological: Negative for adenopathy.  Psychiatric/Behavioral: Negative for behavioral problems and dysphoric mood.       Objective:   Physical Exam Vitals noted Gen' WNWD whie male in no distress Derm - erythematous macular rash on torso at site of tick bite. Skin otherwise clear       Assessment & Plan:  Tick bite - no evidence of RMSF or Lyme's.  Plan - doxycycline 100mg  bid x 7

## 2011-07-07 LAB — CBC
HCT: 43.1
Hemoglobin: 14.8
MCHC: 34.4
MCV: 92.3
RBC: 4.67
RDW: 12.5
WBC: 9.1

## 2011-07-07 LAB — COMPREHENSIVE METABOLIC PANEL
ALT: 73 — ABNORMAL HIGH
AST: 100 — ABNORMAL HIGH
Albumin: 4.3
Alkaline Phosphatase: 54
GFR calc Af Amer: >60
GFR calc non Af Amer: >60
Potassium: 3.5

## 2011-07-07 LAB — DIFFERENTIAL
Basophils Absolute: 0.1
Basophils Relative: 1

## 2011-09-24 ENCOUNTER — Telehealth: Payer: Self-pay | Admitting: *Deleted

## 2011-09-24 MED ORDER — LORAZEPAM 0.5 MG PO TABS: 0.5000 mg | ORAL_TABLET | Freq: Three times a day (TID) | ORAL | Status: DC | PRN

## 2011-09-24 NOTE — Telephone Encounter (Signed)
Ok for refill x 5 

## 2011-09-24 NOTE — Telephone Encounter (Signed)
Refill request from [facility] on [address], requesting refill on Lorazepam 0.5 mg SIG take one tablet by mouth every 8 hours prn for anxiety QTY 60 please Advise refills, last OV 03/28/2011

## 2012-01-04 ENCOUNTER — Emergency Department (HOSPITAL_COMMUNITY)
Admission: EM | Admit: 2012-01-04 | Discharge: 2012-01-04 | Disposition: A | Discharge status: Home / Self Care | Payer: Commercial Managed Care - PPO | Attending: Emergency Medicine | Admitting: Emergency Medicine

## 2012-01-04 ENCOUNTER — Encounter (HOSPITAL_COMMUNITY): Payer: Self-pay | Admitting: *Deleted

## 2012-01-04 ENCOUNTER — Emergency Department (HOSPITAL_COMMUNITY): Payer: Commercial Managed Care - PPO

## 2012-01-04 DIAGNOSIS — R112 Nausea with vomiting, unspecified: Secondary | ICD-10-CM | POA: Insufficient documentation

## 2012-01-04 DIAGNOSIS — N200 Calculus of kidney: Secondary | ICD-10-CM

## 2012-01-04 DIAGNOSIS — R61 Generalized hyperhidrosis: Secondary | ICD-10-CM | POA: Insufficient documentation

## 2012-01-04 DIAGNOSIS — R109 Unspecified abdominal pain: Secondary | ICD-10-CM | POA: Insufficient documentation

## 2012-01-04 DIAGNOSIS — F411 Generalized anxiety disorder: Secondary | ICD-10-CM | POA: Insufficient documentation

## 2012-01-04 DIAGNOSIS — Z79899 Other long term (current) drug therapy: Secondary | ICD-10-CM | POA: Insufficient documentation

## 2012-01-04 DIAGNOSIS — M19019 Primary osteoarthritis, unspecified shoulder: Secondary | ICD-10-CM | POA: Insufficient documentation

## 2012-01-04 LAB — DIFFERENTIAL
Basophils Absolute: 0 10*3/uL (ref 0.0–0.1)
Eosinophils Relative: 1 % (ref 0–5)
Lymphocytes Relative: 23 % (ref 12–46)
Lymphs Abs: 2.9 10*3/uL (ref 0.7–4.0)
Monocytes Absolute: 0.8 10*3/uL (ref 0.1–1.0)

## 2012-01-04 LAB — BASIC METABOLIC PANEL
CO2: 24 mEq/L (ref 19–32)
Calcium: 9.6 mg/dL (ref 8.4–10.5)
Creatinine, Ser: 1.12 mg/dL (ref 0.50–1.35)
Glucose, Bld: 100 mg/dL — ABNORMAL HIGH (ref 70–99)

## 2012-01-04 LAB — URINALYSIS, ROUTINE W REFLEX MICROSCOPIC
Bilirubin Urine: NEGATIVE
Ketones, ur: NEGATIVE mg/dL
Specific Gravity, Urine: 1.02 (ref 1.005–1.030)
Urobilinogen, UA: 0.2 mg/dL (ref 0.0–1.0)

## 2012-01-04 LAB — CBC
HCT: 42.5 % (ref 39.0–52.0)
MCH: 31.2 pg (ref 26.0–34.0)
MCV: 89.1 fL (ref 78.0–100.0)
RDW: 12.1 % (ref 11.5–15.5)
WBC: 12.5 10*3/uL — ABNORMAL HIGH (ref 4.0–10.5)

## 2012-01-04 MED ORDER — KETOROLAC TROMETHAMINE 30 MG/ML IJ SOLN
30.0000 mg | Freq: Once | INTRAMUSCULAR | Status: AC
  Administered 2012-01-04: 30 mg via INTRAVENOUS
  Filled 2012-01-04: qty 1

## 2012-01-04 MED ORDER — ONDANSETRON HCL 4 MG PO TABS: 4.0000 mg | ORAL_TABLET | Freq: Four times a day (QID) | ORAL | Status: AC

## 2012-01-04 MED ORDER — FENTANYL CITRATE 0.05 MG/ML IJ SOLN
INTRAMUSCULAR | Status: AC
  Administered 2012-01-04: 06:00:00
  Filled 2012-01-04: qty 2

## 2012-01-04 MED ORDER — SODIUM CHLORIDE 0.9 % IJ SOLN
3.0000 mL | INTRAMUSCULAR | Status: AC
  Administered 2012-01-04: 3 mL via INTRAVENOUS

## 2012-01-04 MED ORDER — FENTANYL CITRATE 0.05 MG/ML IJ SOLN
50.0000 ug | Freq: Once | INTRAMUSCULAR | Status: AC
  Administered 2012-01-04: 50 ug via INTRAVENOUS
  Filled 2012-01-04: qty 2

## 2012-01-04 MED ORDER — OXYCODONE-ACETAMINOPHEN 5-325 MG PO TABS: 1.0000 | ORAL_TABLET | ORAL | Status: AC | PRN

## 2012-01-04 NOTE — ED Notes (Signed)
IV inserted and fluid bolus started. Labs drawn and sent to lab.

## 2012-01-04 NOTE — ED Notes (Signed)
Pt c/o L flank pain starting yesterday at 1400. C/o nausea, pale, diaphoretic.

## 2012-01-04 NOTE — ED Notes (Signed)
Pt with c/o flank pain. Hx of kidney stones

## 2012-01-04 NOTE — ED Notes (Signed)
WUJ:[id number]<JY> Expected date:<BR> Expected time:<BR> Means of arrival:Ambulance<BR> Comments:<BR> Med clearance

## 2012-01-04 NOTE — ED Provider Notes (Signed)
History     CSN: [id number]  Arrival date & time 01/04/12  7846   First MD Initiated Contact with Patient 01/04/12 [phone]      Chief Complaint  Patient presents with  . Flank Pain    nausea, diaphorectic.    (Consider location/radiation/quality/duration/timing/severity/associated sxs/prior treatment) HPI Pt with history of renal stones p/w L flank pain starting yesterday associated with nausea radiating to LLQ similar to prev renal stone. +hematurai. No fever, chill, penile or testicular pain.  Past Medical History  Diagnosis Date  . Acne   . DJD of shoulder     with h/o sublaxation  . Substance abuse   . Pancreatitis   . IBS (irritable bowel syndrome)   . Chronic anxiety     Past Surgical History  Procedure Date  . Open anterior shoulder reconstruction     for sublaxation  . Nephrostomy tract dilatation w/ lithotripsy     for nephrolithiasis  . Knee arthroscopy     Right  . Shoulder surgery 02/12/2010    Left    Family History  Problem Relation Age of Onset  . Pancreatic cancer    . Colitis    . Crohn's disease Mother     History  Substance Use Topics  . Smoking status: Former Smoker    Types: Cigarettes    Quit date: 10/06/2008  . Smokeless tobacco: Current User    Types: Snuff   Comment: Age 56-21  . Alcohol Use: No      Review of Systems  Constitutional: Negative for fever and chills.  Gastrointestinal: Positive for nausea, vomiting and abdominal pain. Negative for diarrhea and rectal pain.  Genitourinary: Positive for hematuria and flank pain. Negative for penile pain and testicular pain.    Allergies  Cefprozil and Morphine sulfate  Home Medications   Current Outpatient Rx  Name Route Sig Dispense Refill  . LORAZEPAM 0.5 MG PO TABS Oral Take 1 tablet (0.5 mg total) by mouth every 8 (eight) hours as needed for anxiety (for break through anxiety). For anxiety. 60 tablet 5  . SERTRALINE HCL 100 MG PO TABS Oral Take 150 mg by mouth daily.      Marland Kitchen ONDANSETRON HCL 4 MG PO TABS Oral Take 1 tablet (4 mg total) by mouth every 6 (six) hours. 12 tablet 0  . OXYCODONE-ACETAMINOPHEN 5-325 MG PO TABS Oral Take 1 tablet by mouth every 4 (four) hours as needed for pain. 15 tablet 0    BP 144/80  Pulse 76  Temp(Src) 98.6 F (37 C) (Oral)  SpO2 100%  Physical Exam  Nursing note and vitals reviewed. Constitutional: He is oriented to person, place, and time. He appears well-developed and well-nourished. No distress.  HENT:  Head: Normocephalic and atraumatic.  Mouth/Throat: Oropharynx is clear and moist.  Eyes: EOM are normal. Pupils are equal, round, and reactive to light.  Neck: Normal range of motion. Neck supple.  Cardiovascular: Normal rate and regular rhythm.   Pulmonary/Chest: Effort normal and breath sounds normal. No respiratory distress. He has no wheezes. He has no rales.  Abdominal: Soft. Bowel sounds are normal. He exhibits no mass. There is no tenderness. There is no rebound and no guarding.  Musculoskeletal: Normal range of motion. He exhibits no edema and no tenderness.  Neurological: He is alert and oriented to person, place, and time.  Skin: Skin is warm and dry. No rash noted. No erythema.  Psychiatric: He has a normal mood and affect. His behavior is normal.  ED Course  Procedures (including critical care time)  Labs Reviewed  URINALYSIS, ROUTINE W REFLEX MICROSCOPIC - Abnormal; Notable for the following:    APPearance CLOUDY (*)    Hgb urine dipstick LARGE (*)    All other components within normal limits  CBC - Abnormal; Notable for the following:    WBC 12.5 (*)    All other components within normal limits  DIFFERENTIAL - Abnormal; Notable for the following:    Neutro Abs 8.6 (*)    All other components within normal limits  BASIC METABOLIC PANEL - Abnormal; Notable for the following:    Glucose, Bld 100 (*)    All other components within normal limits  URINE MICROSCOPIC-ADD ON   Ct Abdomen Pelvis Wo  Contrast  01/04/2012  *RADIOLOGY REPORT*  Clinical Data: Left-sided flank pain.  Evaluate for kidney stone. History of stones.  CT ABDOMEN AND PELVIS WITHOUT CONTRAST  Technique:  Multidetector CT imaging of the abdomen and pelvis was performed following the standard protocol without intravenous contrast.  Comparison: CT of the abdomen and pelvis without contrast 09/20/2010.  Findings: The  Lung Bases: Unremarkable.  Abdomen/Pelvis:  Image number 71 of series 2 demonstrates a 2 mm calculus at the left UVJ with mild proximal hydroureteronephrosis and mild left-sided perinephric stranding, indicative of mild left- sided obstruction.  Within the collecting system of the left kidney there are a few additional nonobstructive calculi, the largest of which measures 4 mm in the upper pole.  No calcifications within the right renal collecting system or along the course of the right ureter.  No calcifications within the lumen of the urinary bladder.  The visualized portions of the liver are unremarkable on this noncontrast examination.  The unenhanced appearance of the gallbladder, pancreas, spleen and bilateral adrenal glands is unremarkable.  Normal appendix.  No ascites or pneumoperitoneum and no pathologic distension of bowel.  No pathologic lymphadenopathy identified within the abdomen or pelvis.  Musculoskeletal: There are no aggressive appearing lytic or blastic lesions noted in the visualized portions of the skeleton.  IMPRESSION: 1.  Partially obstructive 2 mm calculus at the left ureterovesicular junction with mild proximal left-sided hydroureteronephrosis and perinephric stranding. 2.  There are three additional nonobstructive calculi within the collecting system of the left kidney, the largest of which measures up to 4 mm in the upper pole. 3.  Normal appendix.  Original Report Authenticated By: Florencia Reasons, M.D.     1. Renal stones       MDM  Pt resting comfortably in bed        Loren Racer, MD 01/04/12 [phone]

## 2012-01-04 NOTE — ED Notes (Signed)
Patient informed that urine is needed for urine sample.

## 2012-01-04 NOTE — Discharge Instructions (Signed)
Kidney Stones  Kidney stones (ureteral lithiasis) are solid masses that form inside your kidneys. The intense pain is caused by the stone moving through the kidney, ureter, bladder, and urethra (urinary tract). When the stone moves, the ureter starts to spasm around the stone. The stone is usually passed in the urine.    HOME CARE   Drink enough fluids to keep your pee (urine) clear or pale yellow. This helps to get the stone out.    Strain all pee through the provided strainer. Do not pee without peeing through the strainer, not even once. If you pee the stone out, catch it. The stone may be as small as a grain of salt. Take this to your doctor.    Only take medicine as told by your doctor.    Follow up with your doctor as told.    Get follow-up X-rays as told by your doctor.   GET HELP RIGHT AWAY IF:     Your pain does not get better with medicine.    You have a fever.    Your pain increases and gets worse over 18 hours.    You have new belly (abdominal) pain.    You feel faint or pass out.   MAKE SURE YOU:     Understand these instructions.    Will watch your condition.    Will get help right away if you are not doing well or get worse.   Document Released: 03/10/2008 Document Revised: 09/11/2011 Document Reviewed: 07/20/2009  [facility] Patient Information 2012 [place], [place].

## 2012-01-05 ENCOUNTER — Encounter: Payer: Self-pay | Admitting: Internal Medicine

## 2012-01-05 ENCOUNTER — Ambulatory Visit (INDEPENDENT_AMBULATORY_CARE_PROVIDER_SITE_OTHER): Payer: Commercial Managed Care - PPO | Admitting: Internal Medicine

## 2012-01-05 VITALS — BP 122/80 | HR 66 | Temp 98.1°F | Resp 16 | Wt 230.0 lb

## 2012-01-05 DIAGNOSIS — K589 Irritable bowel syndrome without diarrhea: Secondary | ICD-10-CM

## 2012-01-05 DIAGNOSIS — N200 Calculus of kidney: Secondary | ICD-10-CM

## 2012-01-05 NOTE — Assessment & Plan Note (Signed)
He reports he has been stable on is present regimen.

## 2012-01-05 NOTE — Progress Notes (Signed)
  Subjective:    Patient ID: Floyce Stakes, male    DOB: 24-Feb-1986, 26 y.o.   MRN: [id number]  HPI Mr. Kamiya had another bout with kidney stones recently - was seen at [facility]. CT scan reviewed: retention of 4 mm stone in the renal pelvis and  A couple of smaller stones as well. He reports that he spontaneously passed a stone this AM along with some "sand." His pain is resolved. Prior stone analysis - calcium oxalate. He will be seeing a urologist at [facility] today.  PMH, FamHx and SocHx reviewed for any changes and relevance.    Review of Systems System review is negative for any constitutional, cardiac, pulmonary, GI or neuro symptoms or complaints other than as described in the HPI.     Objective:   Physical Exam Filed Vitals:   01/05/12 1026  BP: 122/80  Pulse: 66  Temp: 98.1 F (36.7 C)  Resp: 16    Wt Readings from Last 3 Encounters:  01/05/12 230 lb (104.327 kg)  03/28/11 210 lb (95.255 kg)  02/19/11 214 lb (97.07 kg)   Gen'l- overweight white man in no distress PUlm - normal respirations Cor -RRR       Assessment & Plan:

## 2012-01-05 NOTE — Assessment & Plan Note (Signed)
Another bout of stones - passed a 3-4 mm stone along with sand. He has several retained stones, largest at 4 mm at the renal pelvis.  Plan - acidify urine with Urocrit or drinking lemon juice in water daily.

## 2012-03-19 ENCOUNTER — Other Ambulatory Visit: Payer: Self-pay | Admitting: Internal Medicine

## 2012-03-19 NOTE — Telephone Encounter (Signed)
Refill sent to [facility].Zoloft. Patient notified.. Patient also asked for refills on Lorazepam and a refill on Bentyl. Last office visit with Dr Debby Bud  01/16/2012. Patient states was previously couple months ago on Bentyl

## 2012-03-26 NOTE — Telephone Encounter (Signed)
Pt called again regarding refill request for Bentyl and Lorazepam, please advise.

## 2012-03-30 ENCOUNTER — Other Ambulatory Visit: Payer: Self-pay | Admitting: *Deleted

## 2012-03-30 NOTE — Telephone Encounter (Signed)
Patient request refill on meds Bentyl and Lorazepam. Last OV 01/16/2012

## 2012-03-30 NOTE — Telephone Encounter (Signed)
Ok for refills x 5 

## 2012-03-31 MED ORDER — LORAZEPAM 0.5 MG PO TABS: 0.5000 mg | ORAL_TABLET | Freq: Three times a day (TID) | ORAL | Status: DC | PRN

## 2012-03-31 MED ORDER — DICYCLOMINE HCL 20 MG PO TABS: 20.0000 mg | ORAL_TABLET | Freq: Four times a day (QID) | ORAL | Status: DC | PRN

## 2012-03-31 NOTE — Telephone Encounter (Signed)
Medications refill called to pharmacy  Dicyclomine and lorazepam to [facility].. Patient notified

## 2012-06-29 ENCOUNTER — Encounter: Payer: Self-pay | Admitting: General Practice

## 2012-06-29 ENCOUNTER — Ambulatory Visit (INDEPENDENT_AMBULATORY_CARE_PROVIDER_SITE_OTHER): Payer: Commercial Managed Care - PPO | Admitting: Internal Medicine

## 2012-06-29 ENCOUNTER — Encounter: Payer: Self-pay | Admitting: Internal Medicine

## 2012-06-29 VITALS — BP 112/76 | HR 69 | Temp 98.1°F | Resp 16 | Wt 228.4 lb

## 2012-06-29 DIAGNOSIS — J019 Acute sinusitis, unspecified: Secondary | ICD-10-CM

## 2012-06-29 MED ORDER — AZITHROMYCIN 250 MG PO TABS: ORAL_TABLET | ORAL | Status: DC

## 2012-06-29 MED ORDER — PROMETHAZINE-PHENYLEPHRINE 6.25-5 MG/5ML PO SYRP: 5.0000 mL | ORAL_SOLUTION | ORAL | Status: DC | PRN

## 2012-06-29 MED ORDER — FLUTICASONE PROPIONATE 50 MCG/ACT NA SUSP: 2.0000 | Freq: Every day | NASAL | Status: AC

## 2012-06-29 NOTE — Patient Instructions (Addendum)
It was good to see you today. Zpak antibiotics x 5 days, nasal spray x 1 month and prescription cough syrup - Your prescription(s) have been submitted to your pharmacy. Please take as directed and contact our office if you believe you are having problem(s) with the medication(s). Alternate between ibuprofen and tylenol for aches, pain and fever symptoms as discussed Hydrate, rest and call if worse or unimproved Work excuse note as discussedSinusitis Sinuses are air pockets within the bones of your face. The growth of bacteria within a sinus leads to infection. The infection prevents the sinuses from draining. This infection is called sinusitis. SYMPTOMS   There will be different areas of pain depending on which sinuses have become infected.  The maxillary sinuses often produce pain beneath the eyes.   Frontal sinusitis may cause pain in the middle of the forehead and above the eyes.  Other problems (symptoms) include:  Toothaches.   Colored, pus-like (purulent) drainage from the nose.   Swelling, warmth, and tenderness over the sinus areas may be signs of infection.  TREATMENT   Sinusitis is most often determined by an exam.X-rays may be taken. If x-rays have been taken, make sure you obtain your results or find out how you are to obtain them. Your caregiver may give you medications (antibiotics). These are medications that will help kill the bacteria causing the infection. You may also be given a medication (decongestant) that helps to reduce sinus swelling.   HOME CARE INSTRUCTIONS    Only take over-the-counter or prescription medicines for pain, discomfort, or fever as directed by your caregiver.   Drink extra fluids. Fluids help thin the mucus so your sinuses can drain more easily.   Applying either moist heat or ice packs to the sinus areas may help relieve discomfort.   Use saline nasal sprays to help moisten your sinuses. The sprays can be found at your local drugstore.  SEEK  IMMEDIATE MEDICAL CARE IF:  You have a fever.   You have increasing pain, severe headaches, or toothache.   You have nausea, vomiting, or drowsiness.   You develop unusual swelling around the face or trouble seeing.  MAKE SURE YOU:    Understand these instructions.   Will watch your condition.   Will get help right away if you are not doing well or get worse.  Document Released: 09/22/2005 Document Revised: 09/11/2011 Document Reviewed: 04/21/2007 [facility] Patient Information 2012 [place], [place].

## 2012-06-29 NOTE — Progress Notes (Signed)
  Subjective:    HPI  complains of head cold symptoms  Onset >1 week ago, wax/wane symptoms - much worse in past 4 days  Initially associated with rhinorrhea, sneezing, sore throat, mild headache and low grade fever Also myalgias, sinus pressure and mild-mod chest congestion> yellow green sputum No relief with OTC meds Precipitated by sick contacts  Past Medical History  Diagnosis Date  . Acne   . DJD of shoulder     with h/o sublaxation  . Substance abuse   . Pancreatitis   . IBS (irritable bowel syndrome)   . Chronic anxiety   . Calcium nephrolithiasis     has residual stone March '13    Review of Systems Constitutional: No night sweats, no unexpected weight change Pulmonary: No pleurisy or hemoptysis Cardiovascular: No chest pain or palpitations     Objective:   Physical Exam BP 112/76  Pulse 69  Temp 98.1 F (36.7 C) (Oral)  Resp 16  Wt 228 lb 6 oz (103.59 kg)  SpO2 99% GEN: mildly ill appearing and audible head>chest congestion HENT: NCAT, mild L max sinus tenderness, nares with yellow discharge, oropharynx mild erythema and PND - foul odor, no exudate Eyes: Vision grossly intact, no conjunctivitis Lungs: Clear to auscultation without rhonchi or wheeze, no increased work of breathing Cardiovascular: Regular rate and rhythm, no bilateral edema      Assessment & Plan:  Viral URI >bacterial sinusitis maxillary Cough, postnasal drip related to above    antibiotics prescribed  Prescription cough suppression and decongestant, also nasal steroid for imflammation - new prescriptions done Symptomatic care with Tylenol or Advil, hydration and rest -  salt gargle advised as needed Work note provided

## 2012-08-16 ENCOUNTER — Other Ambulatory Visit: Payer: Self-pay | Admitting: Internal Medicine

## 2012-08-17 ENCOUNTER — Telehealth: Payer: Self-pay | Admitting: Internal Medicine

## 2012-08-17 NOTE — Telephone Encounter (Signed)
Ok to refill both sertraline and ativan. (Think I did authorize sertraline yesterday.)

## 2012-08-17 NOTE — Telephone Encounter (Signed)
Sertraline was authorized, please advise on quantity and number of refills for Ativan.

## 2012-08-17 NOTE — Telephone Encounter (Signed)
Patient calling about medication refills.  States that the pharmacy advised him to call and f/u "because they said you guys sometimes take up to a week" to do refills.   He has been out of his Sertaline for 3 days.  No refills on the Ativan either.

## 2012-08-18 MED ORDER — LORAZEPAM 0.5 MG PO TABS: 0.5000 mg | ORAL_TABLET | Freq: Three times a day (TID) | ORAL | Status: DC | PRN

## 2012-08-18 NOTE — Telephone Encounter (Signed)
Per current med list: lorazepam 0.5 mg q 8 prn, #60, refill x 5

## 2012-09-08 ENCOUNTER — Encounter: Payer: Self-pay | Admitting: *Deleted

## 2012-09-08 ENCOUNTER — Encounter: Payer: Self-pay | Admitting: Internal Medicine

## 2012-09-08 ENCOUNTER — Ambulatory Visit (INDEPENDENT_AMBULATORY_CARE_PROVIDER_SITE_OTHER)
Admission: RE | Admit: 2012-09-08 | Discharge: 2012-09-08 | Disposition: A | Discharge status: Home / Self Care | Payer: Self-pay | Source: Ambulatory Visit | Attending: Internal Medicine | Admitting: Internal Medicine

## 2012-09-08 ENCOUNTER — Ambulatory Visit (INDEPENDENT_AMBULATORY_CARE_PROVIDER_SITE_OTHER): Payer: BC Managed Care – PPO | Admitting: Internal Medicine

## 2012-09-08 VITALS — BP 102/78 | HR 60 | Temp 97.1°F | Ht 73.0 in | Wt 215.0 lb

## 2012-09-08 DIAGNOSIS — M25519 Pain in unspecified shoulder: Secondary | ICD-10-CM

## 2012-09-08 MED ORDER — HYDROCODONE-ACETAMINOPHEN 10-325 MG PO TABS: 1.0000 | ORAL_TABLET | Freq: Three times a day (TID) | ORAL | Status: DC | PRN

## 2012-09-08 NOTE — Patient Instructions (Signed)
Shoulder Exercises EXERCISES   RANGE OF MOTION (ROM) AND STRETCHING EXERCISES These exercises may help you when beginning to rehabilitate your injury. Your symptoms may resolve with or without further involvement from your physician, physical therapist or athletic trainer. While completing these exercises, remember:    Restoring tissue flexibility helps normal motion to return to the joints. This allows healthier, less painful movement and activity.   An effective stretch should be held for at least 30 seconds.   A stretch should never be painful. You should only feel a gentle lengthening or release in the stretched tissue.  ROM - Pendulum  Bend at the waist so that your right / left arm falls away from your body. Support yourself with your opposite hand on a solid surface, such as a table or a countertop.   Your right / left arm should be perpendicular to the ground. If it is not perpendicular, you need to lean over farther. Relax the muscles in your right / left arm and shoulder as much as possible.   Gently sway your hips and trunk so they move your right / left arm without any use of your right / left shoulder muscles.   Progress your movements so that your right / left arm moves side to side, then forward and backward, and finally, both clockwise and counterclockwise.   Complete __________ repetitions in each direction. Many people use this exercise to relieve discomfort in their shoulder as well as to gain range of motion.  Repeat __________ times. Complete this exercise __________ times per day. STRETCH  Flexion, Standing  Stand with good posture. With an underhand grip on your right / left hand and an overhand grip on the opposite hand, grasp a broomstick or cane so that your hands are a little more than shoulder-width apart.   Keeping your right / left elbow straight and shoulder muscles relaxed, push the stick with your opposite hand to raise your right / left arm in front of your  body and then overhead. Raise your arm until you feel a stretch in your right / left shoulder, but before you have increased shoulder pain.   Try to avoid shrugging your right / left shoulder as your arm rises by keeping your shoulder blade tucked down and toward your mid-back spine. Hold __________ seconds.   Slowly return to the starting position.  Repeat __________ times. Complete this exercise __________ times per day. STRETCH - Internal Rotation  Place your right / left hand behind your back, palm-up.   Throw a towel or belt over your opposite shoulder. Grasp the towel/belt with your right / left hand.   While keeping an upright posture, gently pull up on the towel/belt until you feel a stretch in the front of your right / left shoulder.   Avoid shrugging your right / left shoulder as your arm rises by keeping your shoulder blade tucked down and toward your mid-back spine.   Hold __________. Release the stretch by lowering your opposite hand.  Repeat __________ times. Complete this exercise __________ times per day. STRETCH - External Rotation and Abduction  Stagger your stance through a doorframe. It does not matter which foot is forward.   As instructed by your physician, physical therapist or athletic trainer, place your hands:   And forearms above your head and on the door frame.   And forearms at head-height and on the door frame.   At elbow-height and on the door frame.   Keeping your head  and chest upright and your stomach muscles tight to prevent over-extending your low-back, slowly shift your weight onto your front foot until you feel a stretch across your chest and/or in the front of your shoulders.   Hold __________ seconds. Shift your weight to your back foot to release the stretch.  Repeat __________ times. Complete this stretch __________ times per day.   STRENGTHENING EXERCISES  These exercises may help you when beginning to rehabilitate your injury. They may  resolve your symptoms with or without further involvement from your physician, physical therapist or athletic trainer. While completing these exercises, remember:    Muscles can gain both the endurance and the strength needed for everyday activities through controlled exercises.   Complete these exercises as instructed by your physician, physical therapist or athletic trainer. Progress the resistance and repetitions only as guided.   You may experience muscle soreness or fatigue, but the pain or discomfort you are trying to eliminate should never worsen during these exercises. If this pain does worsen, stop and make certain you are following the directions exactly. If the pain is still present after adjustments, discontinue the exercise until you can discuss the trouble with your clinician.   If advised by your physician, during your recovery, avoid activity or exercises which involve actions that place your right / left hand or elbow above your head or behind your back or head. These positions stress the tissues which are trying to heal.  STRENGTH - Scapular Depression and Adduction  With good posture, sit on a firm chair. Supported your arms in front of you with pillows, arm rests or a table top. Have your elbows in line with the sides of your body.   Gently draw your shoulder blades down and toward your mid-back spine. Gradually increase the tension without tensing the muscles along the top of your shoulders and the back of your neck.   Hold for __________ seconds. Slowly release the tension and relax your muscles completely before completing the next repetition.   After you have practiced this exercise, remove the arm support and complete it in standing as well as sitting.  Repeat __________ times. Complete this exercise __________ times per day.   STRENGTH - External Rotators  Secure a rubber exercise band/tubing to a fixed object so that it is at the same height as your right / left elbow  when you are standing or sitting on a firm surface.   Stand or sit so that the secured exercise band/tubing is at your side that is not injured.   Bend your elbow 90 degrees. Place a folded towel or small pillow under your right / left arm so that your elbow is a few inches away from your side.   Keeping the tension on the exercise band/tubing, pull it away from your body, as if pivoting on your elbow. Be sure to keep your body steady so that the movement is only coming from your shoulder rotating.   Hold __________ seconds. Release the tension in a controlled manner as you return to the starting position.  Repeat __________ times. Complete this exercise __________ times per day.   STRENGTH - Supraspinatus  Stand or sit with good posture. Grasp a __________ weight or an exercise band/tubing so that your hand is "thumbs-up," like when you shake hands.   Slowly lift your right / left hand from your thigh into the air, traveling about 30 degrees from straight out at your side. Lift your hand to shoulder height  or as far as you can without increasing any shoulder pain. Initially, many people do not lift their hands above shoulder height.   Avoid shrugging your right / left shoulder as your arm rises by keeping your shoulder blade tucked down and toward your mid-back spine.   Hold for __________ seconds. Control the descent of your hand as you slowly return to your starting position.  Repeat __________ times. Complete this exercise __________ times per day.   STRENGTH - Shoulder Extensors  Secure a rubber exercise band/tubing so that it is at the height of your shoulders when you are either standing or sitting on a firm arm-less chair.   With a thumbs-up grip, grasp an end of the band/tubing in each hand. Straighten your elbows and lift your hands straight in front of you at shoulder height. Step back away from the secured end of band/tubing until it becomes tense.   Squeezing your shoulder  blades together, pull your hands down to the sides of your thighs. Do not allow your hands to go behind you.   Hold for __________ seconds. Slowly ease the tension on the band/tubing as you reverse the directions and return to the starting position.  Repeat __________ times. Complete this exercise __________ times per day.   STRENGTH - Scapular Retractors  Secure a rubber exercise band/tubing so that it is at the height of your shoulders when you are either standing or sitting on a firm arm-less chair.   With a palm-down grip, grasp an end of the band/tubing in each hand. Straighten your elbows and lift your hands straight in front of you at shoulder height. Step back away from the secured end of band/tubing until it becomes tense.   Squeezing your shoulder blades together, draw your elbows back as you bend them. Keep your upper arm lifted away from your body throughout the exercise.   Hold __________ seconds. Slowly ease the tension on the band/tubing as you reverse the directions and return to the starting position.  Repeat __________ times. Complete this exercise __________ times per day. STRENGTH  Scapular Depressors  Find a sturdy chair without wheels, such as a from a dining room table.   Keeping your feet on the floor, lift your bottom from the seat and lock your elbows.   Keeping your elbows straight, allow gravity to pull your body weight down. Your shoulders will rise toward your ears.   Raise your body against gravity by drawing your shoulder blades down your back, shortening the distance between your shoulders and ears. Although your feet should always maintain contact with the floor, your feet should progressively support less body weight as you get stronger.   Hold __________ seconds. In a controlled and slow manner, lower your body weight to begin the next repetition.  Repeat __________ times. Complete this exercise __________ times per day.   Document Released: 08/06/2005  Document Revised: 12/15/2011 Document Reviewed: 01/04/2009 [facility] Patient Information 2013 [place], [place].   Shoulder Pain The shoulder is the joint that connects your arms to your body. The bones that form the shoulder joint include the upper arm bone (humerus), the shoulder blade (scapula), and the collarbone (clavicle). The top of the humerus is shaped like a ball and fits into a rather flat socket on the scapula (glenoid cavity). A combination of muscles and strong, fibrous tissues that connect muscles to bones (tendons) support your shoulder joint and hold the ball in the socket. Small, fluid-filled sacs (bursae) are located in different areas  of the joint. They act as cushions between the bones and the overlying soft tissues and help reduce friction between the gliding tendons and the bone as you move your arm. Your shoulder joint allows a wide range of motion in your arm. This range of motion allows you to do things like scratch your back or throw a ball. However, this range of motion also makes your shoulder more prone to pain from overuse and injury. Causes of shoulder pain can originate from both injury and overuse and usually can be grouped in the following four categories:  Redness, swelling, and pain (inflammation) of the tendon (tendinitis) or the bursae (bursitis).   Instability, such as a dislocation of the joint.   Inflammation of the joint (arthritis).   Broken bone (fracture).  HOME CARE INSTRUCTIONS    Apply ice to the sore area.   Put ice in a plastic bag.   Place a towel between your skin and the bag.   Leave the ice on for 15 to 20 minutes, 3 to 4 times per day for the first 2 days.   If you have a shoulder sling or immobilizer, wear it as long as your caregiver instructs. Only remove it to shower or bathe. Move your arm as little as possible, but keep your hand moving to prevent swelling.   Only take over-the-counter or prescription medicines for pain, discomfort,  or fever as directed by your caregiver.  SEEK MEDICAL CARE IF:    Your shoulder pain increases, or new pain develops in your arm, hand, or fingers.   Your hand or fingers become cold and numb.   Your pain is not relieved with medicines.  SEEK IMMEDIATE MEDICAL CARE IF:    Your arm, hand, or fingers are numb or tingling.   Your arm, hand, or fingers are significantly swollen or turn white or blue.  MAKE SURE YOU:    Understand these instructions.   Will watch your condition.   Will get help right away if you are not doing well or get worse.  Document Released: 07/02/2005 Document Revised: 12/15/2011 Document Reviewed: 09/06/2011 [facility] Patient Information 2013 [place], [place].

## 2012-09-08 NOTE — Progress Notes (Signed)
Subjective:    Patient ID: Ronald Daniel, male    DOB: 07/08/1986, 26 y.o.   MRN: [id number]  HPI  Pt presents to the clinic today with c/o  Right shoulder pain. He was at an archery shop yesterday when he tried to pull back on a bow with a 70 lb draw. He heard a crack and a pop in his shoulder and has been i 8/10 ever since. He has been taking Tylenol for the pain without relief. He has had previous surgery on that shoulder at the age of 89.  Review of Systems      Past Medical History  Diagnosis Date  . Acne   . DJD of shoulder     with h/o sublaxation  . Substance abuse   . Pancreatitis   . IBS (irritable bowel syndrome)   . Chronic anxiety   . Calcium nephrolithiasis     has residual stone March '13    Current Outpatient Prescriptions  Medication Sig Dispense Refill  . azithromycin (ZITHROMAX Z-PAK) 250 MG tablet Take 2 tablets (500 mg) on  Day 1,  followed by 1 tablet (250 mg) once daily on Days 2 through 5.  6 each  0  . dicyclomine (BENTYL) 20 MG tablet Take 1 tablet (20 mg total) by mouth every 6 (six) hours as needed. before meals  90 tablet  5  . fluticasone (FLONASE) 50 MCG/ACT nasal spray Place 2 sprays into the nose daily.  16 g  6  . LORazepam (ATIVAN) 0.5 MG tablet Take 1 tablet (0.5 mg total) by mouth every 8 (eight) hours as needed for anxiety (for break through anxiety). For anxiety.  60 tablet  5  . promethazine-phenylephrine (PROMETHAZINE-PHENYLEPHRINE) 6.25-5 MG/5ML SYRP Take 5 mLs by mouth every 4 (four) hours as needed.  200 mL  0  . sertraline (ZOLOFT) 100 MG tablet TAKE ONE AND ONE-HALF TABLETS BY MOUTH DAILY  45 tablet  0    Allergies  Allergen Reactions  . Cefprozil   . Morphine Sulfate     REACTION: rash: "alot of morphine at one time gives me hives"    Family History  Problem Relation Age of Onset  . Pancreatic cancer    . Colitis    . Crohn's disease Mother     History   Social History  . Marital Status: Single    Spouse Name: N/A   Number of Children: N/A  . Years of Education: N/A   Occupational History  . Unemployed; Student    Social History Main Topics  . Smoking status: Former Smoker    Types: Cigarettes    Quit date: 10/06/2008  . Smokeless tobacco: Current User    Types: Snuff     Comment: Age [age]  . Alcohol Use: Not on file  . Drug Use: Not on file  . Sexually Active: Not on file   Other Topics Concern  . 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 Single.Patient is a former smoker, age [age], quit 2010Dips snuff intermittentlyPatient has been counseled to quit all tobaccoUsual Childhood Diseases     Constitutional: Denies fever, malaise, fatigue, headache or abrupt weight changes.  Respiratory: Denies difficulty breathing, shortness of breath, cough or sputum production.   Cardiovascular: Denies chest pain, chest tightness, palpitations or swelling in the hands or feet.  Musculoskeletal: Denies decrease in range of motion, difficulty with gait, muscle pain  or joint pain and swelling.  Neurological: Denies numbness or tingling in the extremities, dizziness, difficulty with memory, difficulty with speech or problems with balance and coordination.   No other specific complaints in a complete review of systems (except as listed in HPI above).  Objective:   Physical Exam   BP 102/78  Pulse 60  Temp 97.1 F (36.2 C) (Oral)  Ht 6\' 1"  (1.854 m)  Wt 215 lb (97.523 kg)  BMI 28.37 kg/m2  SpO2 95% Wt Readings from Last 3 Encounters:  09/08/12 215 lb (97.523 kg)  06/29/12 228 lb 6 oz (103.59 kg)  01/05/12 230 lb (104.327 kg)    General: Appears his stated age, well developed, well nourished in NAD.  Cardiovascular: Normal rate and rhythm. S1,S2 noted.  No murmur, rubs or gallops noted. No JVD or BLE edema. No carotid bruits noted. Pulmonary/Chest: Normal effort and positive  vesicular breath sounds. No respiratory distress. No wheezes, rales or ronchi noted. . Musculoskeletal: Crepitus noted with ROM of the right shoulder. Pinpoint tenderness along the AC joint. Normal range of motion. No signs of joint swelling. No difficulty with gait.  Neurological: Alert and oriented. Cranial nerves II-XII intact. Coordination normal. +DTRs bilaterally.      Assessment & Plan:   Shoulder pain, new onset with additional workup required:  RICE care for injuries information given, see patient handout. Xray of right shoulder to r/o bony deformity RX faxed for Vicodin Wrote letter to be out of work until Monday Sep 13, 2012. May need referral to ortho based on xray findings.  RTC as needed or if symptoms persist.

## 2012-09-14 ENCOUNTER — Other Ambulatory Visit: Payer: Self-pay | Admitting: Internal Medicine

## 2012-09-14 ENCOUNTER — Other Ambulatory Visit: Payer: Self-pay | Admitting: *Deleted

## 2012-09-14 ENCOUNTER — Telehealth: Payer: Self-pay | Admitting: *Deleted

## 2012-09-14 DIAGNOSIS — M25519 Pain in unspecified shoulder: Secondary | ICD-10-CM

## 2012-09-14 MED ORDER — SERTRALINE HCL 100 MG PO TABS: ORAL_TABLET | ORAL | Status: DC

## 2012-09-14 NOTE — Telephone Encounter (Signed)
Lucy, I placed the referral. He should hear from our PCC's soon. Thx! Rene Kocher

## 2012-09-14 NOTE — Progress Notes (Signed)
Pt with shoulder pain x 2 weeks. Pt had shoulder injury with prior surgery on that shoulder. Needs referral to ortho.

## 2012-09-14 NOTE — Telephone Encounter (Signed)
Called pt no answer LMOM regina ok referral will be contacted by [facility] once appt has been set-up,,,/lmb

## 2012-09-14 NOTE — Telephone Encounter (Signed)
Pt requesting refill of Sertraline and Hydrocodone-APAP if possible. Rx for Sertraline sent to [facility]-please advise on Hydrocodone.

## 2012-09-14 NOTE — Telephone Encounter (Signed)
Left msg on vm stating saw NP last week. Was told to call back if shoulder still hurting & she would put in a referral to see orthopedic. Requesting referral.../lmb

## 2012-09-15 MED ORDER — HYDROCODONE-ACETAMINOPHEN 10-325 MG PO TABS: 1.0000 | ORAL_TABLET | Freq: Three times a day (TID) | ORAL | Status: DC | PRN

## 2012-09-15 NOTE — Telephone Encounter (Signed)
Rx for Hydrocodone faxed to [facility], pt informed via VM both rx's sent to pharmacy.

## 2012-09-15 NOTE — Telephone Encounter (Signed)
Ash, Its fine to refill the hydrocodone for 1 more month until he sees the orthopedist. Thx! Rene Kocher

## 2012-09-16 ENCOUNTER — Telehealth: Payer: Self-pay | Admitting: Internal Medicine

## 2012-09-16 NOTE — Telephone Encounter (Signed)
Forward  2 pages from Delbert Harness to Dr. Illene Regulus for review on 09-16-12 ym

## 2012-11-01 ENCOUNTER — Ambulatory Visit (INDEPENDENT_AMBULATORY_CARE_PROVIDER_SITE_OTHER): Payer: BC Managed Care – PPO | Admitting: Internal Medicine

## 2012-11-01 ENCOUNTER — Encounter: Payer: Self-pay | Admitting: Internal Medicine

## 2012-11-01 VITALS — BP 118/70 | HR 108 | Temp 97.9°F | Resp 12 | Wt 219.0 lb

## 2012-11-01 DIAGNOSIS — J029 Acute pharyngitis, unspecified: Secondary | ICD-10-CM

## 2012-11-01 MED ORDER — LIDOCAINE VISCOUS 2 % MT SOLN: 10.0000 mL | OROMUCOSAL | Status: DC | PRN

## 2012-11-01 MED ORDER — AMOXICILLIN 875 MG PO TABS: 875.0000 mg | ORAL_TABLET | Freq: Two times a day (BID) | ORAL | Status: DC

## 2012-11-01 NOTE — Progress Notes (Signed)
Subjective:    Patient ID: Ronald Daniel, male    DOB: 07/30/1986, 27 y.o.   MRN: [id number]  HPI Ronald Daniel was last seen in Dec '13 by Ms. Baity for left shoulder pain. X-ray was normal. He is doing regular ROM exercise and is doing OK  He presents with a 24 hr h/o sinus pain/pressure, sore throat, no fever, no chills, positive myalgias. Has odynophagia. Mild cough - yellow mucus.  No N/V  Past Medical History  Diagnosis Date  . Acne   . DJD of shoulder     with h/o sublaxation  . Substance abuse   . Pancreatitis   . IBS (irritable bowel syndrome)   . Chronic anxiety   . Calcium nephrolithiasis     has residual stone March '13   Past Surgical History  Procedure Date  . Open anterior shoulder reconstruction     for sublaxation  . Nephrostomy tract dilatation w/ lithotripsy     for nephrolithiasis  . Knee arthroscopy     Right  . Shoulder surgery 02/12/2010    Left   Family History  Problem Relation Age of Onset  . Pancreatic cancer    . Colitis    . Crohn's disease Mother    History   Social History  . Marital Status: Single    Spouse Name: N/A    Number of Children: N/A  . Years of Education: N/A   Occupational History  . Unemployed; Student    Social History Main Topics  . Smoking status: Former Smoker    Types: Cigarettes    Quit date: 10/06/2008  . Smokeless tobacco: Current User    Types: Snuff     Comment: Age [age]  . Alcohol Use: Not on file  . Drug Use: Not on file  . Sexually Active: Not on file   Other Topics Concern  . 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 Single.Patient is a former smoker, age [age], quit 2010Dips snuff intermittentlyPatient has been counseled to quit all tobaccoUsual Childhood Diseases    Current Outpatient Prescriptions on File Prior to Visit  Medication Sig Dispense Refill  . dicyclomine  (BENTYL) 20 MG tablet Take 1 tablet (20 mg total) by mouth every 6 (six) hours as needed. before meals  90 tablet  5  . LORazepam (ATIVAN) 0.5 MG tablet Take 1 tablet (0.5 mg total) by mouth every 8 (eight) hours as needed for anxiety (for break through anxiety). For anxiety.  60 tablet  5  . sertraline (ZOLOFT) 100 MG tablet TAKE ONE AND ONE-HALF TABLETS BY MOUTH DAILY  45 tablet  1  . fluticasone (FLONASE) 50 MCG/ACT nasal spray Place 2 sprays into the nose daily.  16 g  6      Review of Systems System review is negative for any constitutional, cardiac, pulmonary, GI or neuro symptoms or complaints other than as described in the HPI.     Objective:   Physical Exam Filed Vitals:   11/01/12 1642  BP: 118/70  Pulse: 108  Temp: 97.9 F (36.6 C)  Resp: 12  BMI- 28.9 Gen'l- overweight White male in no distress HEENT- TMs normal, left EAC with abrasion posterior canal. Posterior pharynx with erythema and scant exudate, uvula inflammed. Neck supple Chest CTAP         Assessment & Plan:  Pharyngitis  Plan Amoxicillin 875 mg twice a day x  7 days  Viscous xylocain: mix 1:1 with water, gargle and spit as needed. Caution: do not scald your mouth or bite cheeks and tongue while numb  Tylenol 500-1`,000 mg 3 times a day as needed for fever or myalgia  Vit C 1500 mg daily, hydrate.

## 2012-11-01 NOTE — Patient Instructions (Addendum)
URI/Sinusitis - very tender sinuses. Lungs are clear  Plan - z-pak as directed  Phenergan with codeine cough syrup 1 or 2 tsp every 6 hours as needed  Pseudoephedrine 30 mg 3 times a day for congestion  Whisper - to rest your voice  Hydrate, tylenol as needed. 

## 2012-11-02 ENCOUNTER — Encounter: Payer: Self-pay | Admitting: Internal Medicine

## 2012-11-20 ENCOUNTER — Other Ambulatory Visit: Payer: Self-pay | Admitting: Internal Medicine

## 2012-11-22 ENCOUNTER — Other Ambulatory Visit: Payer: Self-pay | Admitting: *Deleted

## 2012-11-22 MED ORDER — SERTRALINE HCL 100 MG PO TABS: ORAL_TABLET | ORAL | Status: DC

## 2013-01-05 ENCOUNTER — Encounter: Payer: Self-pay | Admitting: Internal Medicine

## 2013-01-05 ENCOUNTER — Ambulatory Visit (INDEPENDENT_AMBULATORY_CARE_PROVIDER_SITE_OTHER): Payer: BC Managed Care – PPO | Admitting: Internal Medicine

## 2013-01-05 VITALS — BP 118/72 | HR 79 | Temp 97.0°F | Ht 73.0 in | Wt 217.0 lb

## 2013-01-05 DIAGNOSIS — J019 Acute sinusitis, unspecified: Secondary | ICD-10-CM

## 2013-01-05 MED ORDER — AMOXICILLIN-POT CLAVULANATE 875-125 MG PO TABS: 1.0000 | ORAL_TABLET | Freq: Two times a day (BID) | ORAL | Status: DC

## 2013-01-05 MED ORDER — HYDROCODONE-HOMATROPINE 5-1.5 MG/5ML PO SYRP: 5.0000 mL | ORAL_SOLUTION | Freq: Three times a day (TID) | ORAL | Status: DC | PRN

## 2013-01-05 NOTE — Patient Instructions (Signed)

## 2013-01-05 NOTE — Progress Notes (Signed)
HPI  Pt presents to the clinic today with 2 day history of nasal congestion, sinus drainage, cough and sore throat. He has been taking Mucinex and tylenol without much relief. He does have a history of allergies and has had sinus issues in the past. He has not had sick contacts. He does work at [employer] and does work in a dusty environment which he thinks is contributing to this.  Review of Systems    Past Medical History  Diagnosis Date  . Acne   . DJD of shoulder     with h/o sublaxation  . Substance abuse   . Pancreatitis   . IBS (irritable bowel syndrome)   . Chronic anxiety   . 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 Pancreatic cancer    . Colitis    . Crohn's disease Mother     History   Social History  . Marital Status: Single    Spouse Name: N/A    Number of Children: N/A  . Years of Education: N/A   Occupational History  . Unemployed; Student    Social History Main Topics  . Smoking status: Former Smoker    Types: Cigarettes    Quit date: 10/06/2008  . Smokeless tobacco: Current User    Types: Snuff     Comment: Age [age]  . Alcohol Use: Not on file  . Drug Use: Not on file  . Sexually Active: Not on file   Other Topics Concern  . 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 Work - [employer] regular part time. Single, but engaged to be married (Jan '14).      Patient is a former smoker, age [age], quit 2010   Dips snuff intermittently   Patient has been counseled to quit all tobacco   Usual Childhood Diseases                Allergies  Allergen Reactions  . Cefprozil   . Morphine Sulfate     REACTION: rash: "alot of morphine at one time gives me hives"     Constitutional: Positive headache, fatigue and fever. Denies abrupt weight changes.  HEENT:  Positive eye  pain, pressure behind the eyes, facial pain, nasal congestion and sore throat. Denies eye redness, ear pain, ringing in the ears, wax buildup, runny nose or bloody nose. Respiratory: Positive cough and thick green sputum production. Denies difficulty breathing or shortness of breath.  Cardiovascular: Denies chest pain, chest tightness, palpitations or swelling in the hands or feet.   No other specific complaints in a complete review of systems (except as listed in HPI above).  Objective:    BP 118/72  Pulse 79  Temp(Src) 97 F (36.1 C) (Oral)  Ht 6\' 1"  (1.854 m)  Wt 217 lb (98.431 kg)  BMI 28.64 kg/m2  SpO2 97% Wt Readings from Last 3 Encounters:  01/05/13 217 lb (98.431 kg)  11/01/12 219 lb (99.338 kg)  09/08/12 215 lb (97.523 kg)    General: Appears his stated age, well developed, well nourished in NAD. HEENT: Head: normal shape and size; Eyes: sclera white, no icterus, conjunctiva pink, PERRLA and EOMs intact; Ears: Tm's gray and intact, normal light reflex; Nose: mucosa pink and moist, septum midline; Throat/Mouth: + PND. Teeth present, mucosa pink and moist, no exudate noted,  no lesions or ulcerations noted.  Neck: Mild cervical lymphadenopathy. Neck supple, trachea midline. No massses, lumps or thyromegaly present.  Cardiovascular: Normal rate and rhythm. S1,S2 noted.  No murmur, rubs or gallops noted. No JVD or BLE edema. No carotid bruits noted. Pulmonary/Chest: Normal effort and positive vesicular breath sounds. No respiratory distress. No wheezes, rales or ronchi noted.      Assessment & Plan:   Acute bacterial sinusitis  Can use a Neti Pot which can be purchased from your local drug store. Flonase 2 sprays each nostril for 3 days and then as needed. Augmentin BID for 10 days Hycodan cough syrup for cough  RTC as needed or if symptoms persist.

## 2013-01-18 ENCOUNTER — Other Ambulatory Visit: Payer: Self-pay

## 2013-01-18 MED ORDER — SERTRALINE HCL 100 MG PO TABS: ORAL_TABLET | ORAL | Status: DC

## 2013-01-19 ENCOUNTER — Telehealth: Payer: Self-pay

## 2013-01-19 DIAGNOSIS — J019 Acute sinusitis, unspecified: Secondary | ICD-10-CM

## 2013-01-19 MED ORDER — LORAZEPAM 0.5 MG PO TABS: 0.5000 mg | ORAL_TABLET | Freq: Three times a day (TID) | ORAL | Status: DC | PRN

## 2013-01-19 NOTE — Telephone Encounter (Signed)
Patient calls stating he would like to switch pharmacies from [facility] to [facility] on [address]. He is requesting a refill on his Ativan. Per protocol while Dr Debby Bud is out of the office this medication was refilled.

## 2013-02-19 ENCOUNTER — Other Ambulatory Visit: Payer: Self-pay | Admitting: Internal Medicine

## 2013-02-21 NOTE — Telephone Encounter (Signed)
Lorazepam called to pharmacy  

## 2013-03-21 ENCOUNTER — Other Ambulatory Visit: Payer: Self-pay | Admitting: Internal Medicine

## 2013-03-22 ENCOUNTER — Other Ambulatory Visit: Payer: Self-pay | Admitting: Internal Medicine

## 2013-03-23 ENCOUNTER — Other Ambulatory Visit: Payer: Self-pay | Admitting: Internal Medicine

## 2013-03-23 NOTE — Telephone Encounter (Signed)
Lorazepam called to pharmacy  

## 2013-04-21 ENCOUNTER — Other Ambulatory Visit: Payer: Self-pay | Admitting: Internal Medicine

## 2013-04-22 ENCOUNTER — Other Ambulatory Visit: Payer: Self-pay | Admitting: Internal Medicine

## 2013-04-25 NOTE — Telephone Encounter (Signed)
Lorazepam called to pharmacy  

## 2013-05-23 ENCOUNTER — Other Ambulatory Visit: Payer: Self-pay | Admitting: Internal Medicine

## 2013-05-23 NOTE — Telephone Encounter (Signed)
Lorazepam called to pharmacy  

## 2013-06-17 ENCOUNTER — Other Ambulatory Visit: Payer: Self-pay | Admitting: Internal Medicine

## 2013-06-17 NOTE — Telephone Encounter (Signed)
Ok to refill 30d, no refill (per protocol covering for absent PCP) - prescription printed and signed - placed on triage desk  

## 2013-06-17 NOTE — Telephone Encounter (Signed)
Faxed script back to [facility].../lmb 

## 2013-06-17 NOTE — Telephone Encounter (Signed)
MD is out of office. Pls advise on refill.../lmb 

## 2013-07-12 ENCOUNTER — Other Ambulatory Visit: Payer: Self-pay | Admitting: Internal Medicine

## 2013-07-12 NOTE — Telephone Encounter (Signed)
Lorazepam called to pharmacy  

## 2013-08-08 ENCOUNTER — Other Ambulatory Visit: Payer: Self-pay | Admitting: Internal Medicine

## 2013-09-21 ENCOUNTER — Ambulatory Visit (INDEPENDENT_AMBULATORY_CARE_PROVIDER_SITE_OTHER): Payer: 59 | Admitting: Internal Medicine

## 2013-09-21 ENCOUNTER — Encounter: Payer: Self-pay | Admitting: Internal Medicine

## 2013-09-21 VITALS — BP 132/70 | HR 84 | Temp 97.5°F | Wt 209.0 lb

## 2013-09-21 DIAGNOSIS — J019 Acute sinusitis, unspecified: Secondary | ICD-10-CM

## 2013-09-21 MED ORDER — AMOXICILLIN-POT CLAVULANATE 875-125 MG PO TABS: 1.0000 | ORAL_TABLET | Freq: Two times a day (BID) | ORAL | Status: AC

## 2013-09-21 NOTE — Progress Notes (Signed)
Subjective:     Patient ID: Ronald Daniel, male   DOB: January 30, 1986, 27 y.o.   MRN: [id number]  HPI Ronald Daniel is a 27 yo male with PMH of seasonal allergies, GERD and IBS who presents today with a 3 day history of sore throat, congestion and cough.  He had a sore throat, congestion, and cough for past 3 days. He frequently gets these symptoms in the Winter, but his symptoms are worse than normal. He has a "barking cough. "He is coughing up yellowish-green mucous throughout the day. He is having some drainage in the back of his throat. He says his throat feels "raw" because of his cough. He is using cough drops with some relief. He is having pressure in his maxillary and frontal sinuses. He has been taking nasal spray (flonase), which has been helping. He has headache concentrated in the center of his forehead. The pain does not radiate his anywhere. He has not noticed a fever, but has been taking tylenol regularly. He has a dull ache pain in both ears. He also has noticed his eyes have felt itchy. He is taking dayquil to help with his symptoms.   He is having some nausea, but his appetite is decreased.   He works at [employer] and it is dusty, which could be aggravating his symptoms. He is not allowed time off during the holiday season, but he felt so bad he didn't go into work today.  Review of Systems Negative except as mentioned  In HPI.     Objective:   Physical Exam HEENT: Antiicteric sclerae. PEERL. Tympanic membranes: R slightly erythematous. L Tympanic membrane clear.  Right sided cervical lymphadenopathy. MMM.  White exudates at back of throat.  No thyromegaly. Frontal and maxillary sinuses tender to palpation. CV: RRR. Normal S1, S2. No murmurs, rubs or gallops. 2+ radial pulses. Pulm: Lungs clear to auscultation bilaterally. No crackles, rales or rhonchi. Normal work of breathing.  Abd: Soft, non-tender rand non-distended.     Assessment:     Ronald Daniel is a 27 yo male with PMH of GERD and IBS who  presents today with a 3 day history of sore throat, congestion and cough concerning for acute sinusitis.  1) Acute Sinusitis: Augmentin BID for 7 days. Robitussin DM for cough. Sudafed 30 mg as needed for congestion.

## 2013-09-21 NOTE — Progress Notes (Signed)
Pre visit review using our clinic review tool, if applicable. No additional management support is needed unless otherwise documented below in the visit note. 

## 2013-09-21 NOTE — Patient Instructions (Signed)
We are going to send you an antibiotic (augmentin) to help with your infection. The antibiotic should help your symptoms.  You have the symptoms consistent with acute sinusitis.   You can use 30 mg of Sudafed (ask the pharmacist for it) a couple times a day to open up your sinuses. You can use robitussin DM to help with your cough.

## 2013-10-26 ENCOUNTER — Other Ambulatory Visit: Payer: Self-pay | Admitting: Internal Medicine

## 2013-10-27 MED ORDER — SERTRALINE HCL 100 MG PO TABS: ORAL_TABLET | ORAL | Status: DC

## 2013-10-27 NOTE — Addendum Note (Signed)
Addended by: Lyanne CoANDREWS, Corey Caulfield R on: 10/27/2013 01:31 PM   Modules accepted: Orders

## 2013-11-28 ENCOUNTER — Other Ambulatory Visit: Payer: Self-pay | Admitting: Internal Medicine

## 2013-12-27 ENCOUNTER — Other Ambulatory Visit: Payer: Self-pay | Admitting: Internal Medicine

## 2014-02-01 ENCOUNTER — Telehealth: Payer: Self-pay | Admitting: Internal Medicine

## 2014-02-01 MED ORDER — SERTRALINE HCL 100 MG PO TABS: ORAL_TABLET | ORAL | Status: DC

## 2014-02-01 NOTE — Telephone Encounter (Signed)
Patient states that he has been out of his sertraline (ZOLOFT) 100 MG tablet medication for 1 week and is not doing well without it. He is calling to see if we can refill until he finds a new doctor. Former Norins pt. Please advise.

## 2014-02-01 NOTE — Telephone Encounter (Signed)
Sertraline 100 mg  #30 Start with 1/2 pill X 10 days then 1 qd

## 2014-02-21 ENCOUNTER — Other Ambulatory Visit: Payer: Self-pay | Admitting: Internal Medicine

## 2014-02-21 NOTE — Telephone Encounter (Signed)
Dr Debby BudNorins patient  Last ov on 09/21/13 Med last filled 02/01/14 #30

## 2014-02-21 NOTE — Telephone Encounter (Signed)
#  30, RX 1 Needs to establish with new PCP

## 2014-04-28 ENCOUNTER — Ambulatory Visit: Payer: Self-pay | Admitting: General Practice

## 2014-06-06 ENCOUNTER — Encounter: Payer: Self-pay | Admitting: Internal Medicine

## 2014-08-09 ENCOUNTER — Emergency Department (HOSPITAL_COMMUNITY)
Admission: EM | Admit: 2014-08-09 | Discharge: 2014-08-09 | Disposition: A | Discharge status: Home / Self Care | Payer: Worker's Compensation | Attending: Emergency Medicine | Admitting: Emergency Medicine

## 2014-08-09 ENCOUNTER — Encounter (HOSPITAL_COMMUNITY): Payer: Self-pay | Admitting: *Deleted

## 2014-08-09 DIAGNOSIS — F419 Anxiety disorder, unspecified: Secondary | ICD-10-CM | POA: Diagnosis not present

## 2014-08-09 DIAGNOSIS — Z8719 Personal history of other diseases of the digestive system: Secondary | ICD-10-CM | POA: Insufficient documentation

## 2014-08-09 DIAGNOSIS — Z79899 Other long term (current) drug therapy: Secondary | ICD-10-CM | POA: Diagnosis not present

## 2014-08-09 DIAGNOSIS — Y288XXA Contact with other sharp object, undetermined intent, initial encounter: Secondary | ICD-10-CM | POA: Diagnosis not present

## 2014-08-09 DIAGNOSIS — S0181XA Laceration without foreign body of other part of head, initial encounter: Secondary | ICD-10-CM

## 2014-08-09 DIAGNOSIS — Y9289 Other specified places as the place of occurrence of the external cause: Secondary | ICD-10-CM | POA: Insufficient documentation

## 2014-08-09 DIAGNOSIS — Z872 Personal history of diseases of the skin and subcutaneous tissue: Secondary | ICD-10-CM | POA: Insufficient documentation

## 2014-08-09 DIAGNOSIS — Z87442 Personal history of urinary calculi: Secondary | ICD-10-CM | POA: Insufficient documentation

## 2014-08-09 DIAGNOSIS — Z8739 Personal history of other diseases of the musculoskeletal system and connective tissue: Secondary | ICD-10-CM | POA: Diagnosis not present

## 2014-08-09 DIAGNOSIS — Y9389 Activity, other specified: Secondary | ICD-10-CM | POA: Insufficient documentation

## 2014-08-09 DIAGNOSIS — Z87891 Personal history of nicotine dependence: Secondary | ICD-10-CM | POA: Diagnosis not present

## 2014-08-09 DIAGNOSIS — Z7951 Long term (current) use of inhaled steroids: Secondary | ICD-10-CM | POA: Insufficient documentation

## 2014-08-09 DIAGNOSIS — Z792 Long term (current) use of antibiotics: Secondary | ICD-10-CM | POA: Insufficient documentation

## 2014-08-09 DIAGNOSIS — Y990 Civilian activity done for income or pay: Secondary | ICD-10-CM | POA: Insufficient documentation

## 2014-08-09 DIAGNOSIS — Z23 Encounter for immunization: Secondary | ICD-10-CM | POA: Insufficient documentation

## 2014-08-09 DIAGNOSIS — W208XXA Other cause of strike by thrown, projected or falling object, initial encounter: Secondary | ICD-10-CM | POA: Diagnosis not present

## 2014-08-09 MED ORDER — TETANUS-DIPHTH-ACELL PERTUSSIS 5-2.5-18.5 LF-MCG/0.5 IM SUSP
0.5000 mL | Freq: Once | INTRAMUSCULAR | Status: AC
  Administered 2014-08-09: 0.5 mL via INTRAMUSCULAR
  Filled 2014-08-09: qty 0.5

## 2014-08-09 MED ORDER — HYDROCODONE-ACETAMINOPHEN 5-325 MG PO TABS
1.0000 | ORAL_TABLET | Freq: Once | ORAL | Status: AC
  Administered 2014-08-09: 1 via ORAL
  Filled 2014-08-09: qty 1

## 2014-08-09 NOTE — ED Provider Notes (Signed)
CSN: [id number]     Arrival date & time 08/09/14  1024 History   First MD Initiated Contact with Patient 08/09/14 1041     Chief Complaint  Patient presents with  . Facial Laceration     (Consider location/radiation/quality/duration/timing/severity/associated sxs/prior Treatment) HPI Comments: This is a 28 year old male who presents to the emergency department with a laceration to his chin occurring just prior to arrival. Patient reports a piece of metal fell back and hit him in the face. No loss of consciousness. States the area is painful. No aggravating or alleviating factors. Unknown last tetanus shot.  The history is provided by the patient.    Past Medical History  Diagnosis Date  . Acne   . DJD of shoulder     with h/o sublaxation  . Substance abuse   . Pancreatitis   . IBS (irritable bowel syndrome)   . Chronic anxiety   . 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 Open anterior shoulder reconstruction      for sublaxation  . Nephrostomy tract dilatation w/ lithotripsy      for nephrolithiasis  . Knee arthroscopy      Right  . Shoulder surgery  02/12/2010    Left   Family History  Problem Relation Age of Onset  . Pancreatic cancer    . Colitis    . Crohn's disease Mother    History  Substance Use Topics  . Smoking status: Former Smoker    Types: Cigarettes    Quit date: 10/06/2008  . Smokeless tobacco: Current User    Types: Snuff     Comment: Age [age]  . Alcohol Use: Yes     Comment: occasional    Review of Systems  Constitutional: Negative.   HENT: Negative for dental problem.   Respiratory: Negative.   Cardiovascular: Negative.   Gastrointestinal: Negative for nausea.  Skin: Positive for wound.  Neurological: Negative.       Allergies  Cefprozil and Morphine sulfate  Home Medications   Prior to Admission medications   Medication Sig Start Date End Date Taking?  Authorizing Provider  amoxicillin-clavulanate (AUGMENTIN) 875-125 MG per tablet Take 1 tablet by mouth 2 (two) times daily. 09/21/13   Jacques NavyMichael E Norins, MD  fluticasone (FLONASE) 50 MCG/ACT nasal spray Place 2 sprays into the nose daily. 06/29/12   Newt LukesValerie A Leschber, MD  LORazepam (ATIVAN) 0.5 MG tablet TAKE ONE TABLET BY MOUTH EVERY 8 HOURS AS NEEDED FOR ANXIETY 08/08/13   Jacques NavyMichael E Norins, MD  sertraline (ZOLOFT) 100 MG tablet TAKE ONE-HALF TABLET BY MOUTH ONCE DAILY FOR 10 DAYS AND THEN ONE TABLET ONCE DAILY 02/21/14   Pecola LawlessWilliam F Hopper, MD   BP 145/95 mmHg  Pulse 64  Temp(Src) 98.3 F (36.8 C) (Oral)  Resp 16  SpO2 96% Physical Exam  Constitutional: He is oriented to person, place, and time. He appears well-developed and well-nourished. No distress.  HENT:  Head: Normocephalic and atraumatic.    Dentition intact. No dental injury.  Eyes: Conjunctivae and EOM are normal.  Neck: Normal range of motion. Neck supple.  Cardiovascular: Normal rate, regular rhythm and normal heart sounds.   Pulmonary/Chest: Effort normal and breath sounds normal.  Musculoskeletal: Normal range of motion. He exhibits no edema.  Neurological: He is alert and oriented to person, place, and time.  Skin: Skin is warm and dry.  Psychiatric: He has a normal mood and  affect. His behavior is normal.  Nursing note and vitals reviewed.   ED Course  Procedures (including critical care time) LACERATION REPAIR Performed by: Celene SkeenHess, Ceonna Frazzini Authorized by: Celene SkeenHess, Clearance Chenault Consent: Verbal consent obtained. Risks and benefits: risks, benefits and alternatives were discussed Consent given by: patient Patient identity confirmed: provided demographic data Prepped and Draped in normal sterile fashion Wound explored  Laceration Location: chin  Laceration Length: 1 cm  No Foreign Bodies seen or palpated  Anesthesia: none Irrigation method: syringe Amount of cleaning: standard  Skin closure: dermabond  Patient  tolerance: Patient tolerated the procedure well with no immediate complications.  Labs Review Labs Reviewed - No data to display  Imaging Review No results found.   EKG Interpretation None      MDM   Final diagnoses:  Chin laceration, initial encounter   Patient with laceration to his chin. No loss of consciousness. No active bleeding. Closed with Dermabond. No dental injury. Stable for discharge. Return precautions given. Patient states understanding of treatment care plan and is agreeable.   Kathrynn SpeedRobyn M Rishi Vicario, PA-C 08/09/14 1143  Shon Batonourtney F Horton, MD 08/10/14 [phone]

## 2014-08-09 NOTE — Discharge Instructions (Signed)
Facial Laceration  A facial laceration is a cut on the face. These injuries can be painful and cause bleeding. Lacerations usually heal quickly, but they need special care to reduce scarring. DIAGNOSIS  Your health care provider will take a medical history, ask for details about how the injury occurred, and examine the wound to determine how deep the cut is. TREATMENT  Some facial lacerations may not require closure. Others may not be able to be closed because of an increased risk of infection. The risk of infection and the chance for successful closure will depend on various factors, including the amount of time since the injury occurred. The wound may be cleaned to help prevent infection. If closure is appropriate, pain medicines may be given if needed. Your health care provider will use stitches (sutures), wound glue (adhesive), or skin adhesive strips to repair the laceration. These tools bring the skin edges together to allow for faster healing and a better cosmetic outcome. If needed, you may also be given a tetanus shot. HOME CARE INSTRUCTIONS  Only take over-the-counter or prescription medicines as directed by your health care provider.  Follow your health care provider's instructions for wound care. These instructions will vary depending on the technique used for closing the wound. For Sutures:  Keep the wound clean and dry.   If you were given a bandage (dressing), you should change it at least once a day. Also change the dressing if it becomes wet or dirty, or as directed by your health care provider.   Wash the wound with soap and water 2 times a day. Rinse the wound off with water to remove all soap. Pat the wound dry with a clean towel.   After cleaning, apply a thin layer of the antibiotic ointment recommended by your health care provider. This will help prevent infection and keep the dressing from sticking.   You may shower as usual after the first 24 hours. Do not soak the  wound in water until the sutures are removed.   Get your sutures removed as directed by your health care provider. With facial lacerations, sutures should usually be taken out after 4-5 days to avoid stitch marks.   Wait a few days after your sutures are removed before applying any makeup. For Skin Adhesive Strips:  Keep the wound clean and dry.   Do not get the skin adhesive strips wet. You may bathe carefully, using caution to keep the wound dry.   If the wound gets wet, pat it dry with a clean towel.   Skin adhesive strips will fall off on their own. You may trim the strips as the wound heals. Do not remove skin adhesive strips that are still stuck to the wound. They will fall off in time.  For Wound Adhesive:  You may briefly wet your wound in the shower or bath. Do not soak or scrub the wound. Do not swim. Avoid periods of heavy sweating until the skin adhesive has fallen off on its own. After showering or bathing, gently pat the wound dry with a clean towel.   Do not apply liquid medicine, cream medicine, ointment medicine, or makeup to your wound while the skin adhesive is in place. This may loosen the film before your wound is healed.   If a dressing is placed over the wound, be careful not to apply tape directly over the skin adhesive. This may cause the adhesive to be pulled off before the wound is healed.   Avoid   prolonged exposure to sunlight or tanning lamps while the skin adhesive is in place.  The skin adhesive will usually remain in place for 5-10 days, then naturally fall off the skin. Do not pick at the adhesive film.  After Healing: Once the wound has healed, cover the wound with sunscreen during the day for 1 full year. This can help minimize scarring. Exposure to ultraviolet light in the first year will darken the scar. It can take 1-2 years for the scar to lose its redness and to heal completely.  SEEK IMMEDIATE MEDICAL CARE IF:  You have redness, pain, or  swelling around the wound.   You see ayellowish-white fluid (pus) coming from the wound.   You have chills or a fever.  MAKE SURE YOU:  Understand these instructions.  Will watch your condition.  Will get help right away if you are not doing well or get worse. Document Released: 10/30/2004 Document Revised: 07/13/2013 Document Reviewed: 05/05/2013 ExitCare Patient Information 2015 [employer]. This information is not intended to replace advice given to you by your health care provider. Make sure you discuss any questions you have with your health care provider.  

## 2014-08-09 NOTE — ED Notes (Signed)
Pt escorted to discharge window. Pt verbalized understanding discharge instructions. In no acute distress.  

## 2014-08-09 NOTE — ED Notes (Signed)
Pt was working in pharmacy, pt is workers comp. Piece of metal whipped back and cut pt on face, below lip. Laceration 1/4 inch.

## 2017-01-09 ENCOUNTER — Emergency Department (HOSPITAL_COMMUNITY): Payer: Self-pay

## 2017-01-09 ENCOUNTER — Emergency Department (HOSPITAL_COMMUNITY)
Admission: EM | Admit: 2017-01-09 | Discharge: 2017-01-09 | Disposition: A | Discharge status: Home / Self Care | Payer: Self-pay | Attending: Emergency Medicine | Admitting: Emergency Medicine

## 2017-01-09 ENCOUNTER — Encounter (HOSPITAL_COMMUNITY): Payer: Self-pay | Admitting: Emergency Medicine

## 2017-01-09 DIAGNOSIS — Z87891 Personal history of nicotine dependence: Secondary | ICD-10-CM | POA: Insufficient documentation

## 2017-01-09 DIAGNOSIS — R569 Unspecified convulsions: Secondary | ICD-10-CM | POA: Insufficient documentation

## 2017-01-09 DIAGNOSIS — R251 Tremor, unspecified: Secondary | ICD-10-CM | POA: Insufficient documentation

## 2017-01-09 DIAGNOSIS — Z79899 Other long term (current) drug therapy: Secondary | ICD-10-CM | POA: Insufficient documentation

## 2017-01-09 LAB — BASIC METABOLIC PANEL
ANION GAP: 8 (ref 5–15)
BUN: 12 mg/dL (ref 6–20)
CHLORIDE: 104 mmol/L (ref 101–111)
CO2: 22 mmol/L (ref 22–32)
CREATININE: 1.05 mg/dL (ref 0.61–1.24)
Calcium: 9.5 mg/dL (ref 8.9–10.3)
GFR calc non Af Amer: >60 mL/min (ref >60)
Glucose, Bld: 90 mg/dL (ref 65–99)
POTASSIUM: 3.4 mmol/L — AB (ref 3.5–5.1)
SODIUM: 134 mmol/L — AB (ref 135–145)

## 2017-01-09 LAB — URINALYSIS, ROUTINE W REFLEX MICROSCOPIC
Bilirubin Urine: NEGATIVE
GLUCOSE, UA: NEGATIVE mg/dL
HGB URINE DIPSTICK: NEGATIVE
Ketones, ur: NEGATIVE mg/dL
Leukocytes, UA: NEGATIVE
Nitrite: NEGATIVE
PROTEIN: NEGATIVE mg/dL
Specific Gravity, Urine: 1.023 (ref 1.005–1.030)
pH: 5 (ref 5.0–8.0)

## 2017-01-09 LAB — CBC
HEMATOCRIT: 41.8 % (ref 39.0–52.0)
HEMOGLOBIN: 15.1 g/dL (ref 13.0–17.0)
MCH: 32.3 pg (ref 26.0–34.0)
MCHC: 36.1 g/dL — ABNORMAL HIGH (ref 30.0–36.0)
MCV: 89.3 fL (ref 78.0–100.0)
PLATELETS: 213 10*3/uL (ref 150–400)
RBC: 4.68 MIL/uL (ref 4.22–5.81)
RDW: 12.2 % (ref 11.5–15.5)
WBC: 14.2 10*3/uL — AB (ref 4.0–10.5)

## 2017-01-09 LAB — RAPID URINE DRUG SCREEN, HOSP PERFORMED
Amphetamines: NOT DETECTED
BENZODIAZEPINES: POSITIVE — AB
Barbiturates: NOT DETECTED
COCAINE: NOT DETECTED
Opiates: NOT DETECTED
Tetrahydrocannabinol: NOT DETECTED

## 2017-01-09 LAB — CBG MONITORING, ED: Glucose-Capillary: 102 mg/dL — ABNORMAL HIGH (ref 65–99)

## 2017-01-09 LAB — CK: Total CK: 180 U/L (ref 49–397)

## 2017-01-09 LAB — ETHANOL: Alcohol, Ethyl (B): <5 mg/dL (ref <5)

## 2017-01-09 MED ORDER — MAGIC MOUTHWASH
2.0000 mL | Freq: Once | ORAL | Status: AC
  Administered 2017-01-09: 2 mL via ORAL
  Filled 2017-01-09: qty 5

## 2017-01-09 MED ORDER — HYDROCODONE-ACETAMINOPHEN 5-325 MG PO TABS
1.0000 | ORAL_TABLET | Freq: Once | ORAL | Status: AC
  Administered 2017-01-09: 1 via ORAL
  Filled 2017-01-09: qty 1

## 2017-01-09 MED ORDER — SODIUM CHLORIDE 0.9 % IV BOLUS (SEPSIS)
1000.0000 mL | Freq: Once | INTRAVENOUS | Status: AC
  Administered 2017-01-09: 1000 mL via INTRAVENOUS

## 2017-01-09 MED ORDER — KETOROLAC TROMETHAMINE 60 MG/2ML IM SOLN
60.0000 mg | Freq: Once | INTRAMUSCULAR | Status: DC
  Filled 2017-01-09: qty 2

## 2017-01-09 MED ORDER — LORAZEPAM 2 MG/ML IJ SOLN
1.0000 mg | Freq: Once | INTRAMUSCULAR | Status: AC
  Administered 2017-01-09: 1 mg via INTRAVENOUS
  Filled 2017-01-09: qty 1

## 2017-01-09 NOTE — ED Notes (Signed)
Bed: WA16 Expected date:  Expected time:  Means of arrival:  Comments: RES A 

## 2017-01-09 NOTE — ED Triage Notes (Signed)
Patient reports possible seizure happening this morning around 7:30. Patient does not have hx of seizures. Witness states patient was laying stiff (unsure of how long this lasted), then afterwards patient was speaking gibberish. Patient reports biting his tongue. No bleeding noted. Patient is now conscious, alert, oriented, ambulatory. Patient is complaining of generalized pain all over.

## 2017-01-09 NOTE — ED Provider Notes (Signed)
[facility]-EMERGENCY DEPT Provider Note   CSN: [id number] Arrival date & time: 01/09/17  1038     History   Chief Complaint Chief Complaint  Patient presents with  . Possible Seizure    HPI Ronald Daniel. is a 31 y.o. male presenting after sudden onset of what appears to be a new onset seizure. Girlfriend is in the room providing history. Last thing that patient remembers was sitting down on the sofa to watch TV with a PB&J Sandwich. She reports that she heard him speaking in nonsensical words and went to check on him and he was "stiff" and non-responsive to her. The witness portion of the episode lasted about 30-45 seconds. He then proceeded to swing at her and appeared really confused and not understanding what she was saying. He slowly came back with that and thought that she was talking nonsense and nondistended her was really frustrated. He has been feeling as though his legs are stiff and hurting and weak as well as tenderness to his tongue. He also reports getting low on his clonazepam and not taking any for the last 2 days. He typically takes 2 mg a day.   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  HPI  Past Medical History:  Diagnosis Date  . Acne   . Calcium nephrolithiasis    has residual stone March '13  . Chronic anxiety   . DJD of shoulder    with h/o sublaxation  . IBS (irritable bowel syndrome)   . Pancreatitis   . Substance abuse     Patient Active Problem List   Diagnosis Date Noted  . Anxiety neurosis 02/20/2011  . RECTAL BLEEDING 05/10/2010  . RECTAL PAIN 05/10/2010  . WEIGHT LOSS-ABNORMAL 05/10/2010  . ABDOMINAL PAIN, LOWER 05/10/2010  . WEIGHT GAIN 10/30/2009  . INCREASED BLOOD PRESSURE 10/30/2009  . ABNORMAL TRANSAMINASE-LFT'S 06/26/2008  . DIARRHEA, CHRONIC 03/10/2008  . EPIGASTRIC PAIN 03/10/2008  . GERD 01/03/2008  . IBS  01/03/2008  . 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 KNEE ARTHROSCOPY     Right  . NEPHROSTOMY TRACT DILATATION W/ LITHOTRIPSY     for nephrolithiasis  . OPEN ANTERIOR SHOULDER RECONSTRUCTION     for sublaxation  . SHOULDER SURGERY  02/12/2010   Left       Home Medications    Prior to Admission medications   Medication Sig Start Date End Date Taking? Authorizing Provider  amoxicillin-clavulanate (AUGMENTIN) 875-125 MG per tablet Take 1 tablet by mouth 2 (two) times daily. 09/21/13   Jacques Navy, MD  fluticasone (FLONASE) 50 MCG/ACT nasal spray Place 2 sprays into the nose daily. 06/29/12   Newt Lukes, MD  LORazepam (ATIVAN) 0.5 MG tablet TAKE ONE TABLET BY MOUTH EVERY 8 HOURS AS NEEDED FOR ANXIETY 08/08/13   Jacques Navy, MD  sertraline (ZOLOFT) 100 MG tablet TAKE ONE-HALF TABLET BY MOUTH ONCE DAILY FOR 10 DAYS AND THEN ONE TABLET ONCE DAILY 02/21/14   Pecola Lawless, MD    Family History Family History  Problem Relation Age of Onset  . Pancreatic cancer    . Colitis    . Crohn's disease Mother     Social History Social History  Substance Use Topics  . Smoking status: Former Smoker    Types: Cigarettes    Quit  date: 10/06/2008  . Smokeless tobacco: Current User    Types: Snuff     Comment: Age 40-22  . Alcohol use Yes     Comment: occasional     Allergies   Cefprozil and Morphine sulfate   Review of Systems Review of Systems  Constitutional: Negative for chills and fever.  HENT: Positive for congestion. Negative for ear pain and sore throat.        Tongue biting and pain  Eyes: Negative for pain and visual disturbance.  Respiratory: Negative for cough, chest tightness, shortness of breath, wheezing and stridor.   Cardiovascular: Negative for chest pain, palpitations and leg swelling.  Gastrointestinal: Negative for abdominal distention, abdominal pain, blood in stool,  diarrhea, nausea and vomiting.  Genitourinary: Negative for difficulty urinating, dysuria, flank pain and hematuria.  Musculoskeletal: Positive for myalgias. Negative for arthralgias, back pain, joint swelling, neck pain and neck stiffness.       He reports pain in both thighs and feeling weak in both extremities bilaterally.  Skin: Negative for color change, pallor and rash.  Neurological: Positive for tremors, seizures and weakness. Negative for dizziness, syncope, facial asymmetry, speech difficulty, light-headedness, numbness and headaches.       Patient has a mild tremor at baseline  All other systems reviewed and are negative.    Physical Exam Updated Vital Signs BP 126/67   Pulse 87   Temp 97.6 F (36.4 C) (Oral)   Resp 17   Ht  (1.854 m)   Wt 108.9 kg   SpO2 100%   BMI 31.66 kg/m   Physical Exam  Constitutional: He is oriented to person, place, and time. He appears well-developed and well-nourished. No distress.  Afebrile, nontoxic-appearing, lying comfortably in bed in no acute distress.  HENT:  Head: Normocephalic and atraumatic.  Mouth/Throat: Oropharynx is clear and moist. No oropharyngeal exudate.  Evidence of tongue biting bilaterally L>R  Eyes: Conjunctivae and EOM are normal. Pupils are equal, round, and reactive to light. Right eye exhibits no discharge. Left eye exhibits no discharge.  Neck: Normal range of motion. Neck supple. No tracheal deviation present.  Cardiovascular: Normal rate, regular rhythm, normal heart sounds and intact distal pulses.   No murmur heard. Pulmonary/Chest: Effort normal and breath sounds normal. No respiratory distress. He has no wheezes. He has no rales. He exhibits no tenderness.  Abdominal: Soft. He exhibits no distension and no mass. There is no tenderness. There is no rebound and no guarding.  Musculoskeletal: Normal range of motion. He exhibits no edema, tenderness or deformity.  Neurological: He is alert and oriented to  person, place, and time. No cranial nerve deficit or sensory deficit. Coordination normal.  Neurologic Exam:   - Mental status: Patient is alert and cooperative. Fluent speech and words are clear. Coherent thought processes and insight is good. Patient is oriented x 4 to person, place, time and event.   - Cranial nerves:  CN III, IV, VI: pupils equally round, reactive to light both direct and conscensual and normal accommodation. Full extra-ocular movement. CN V: motor temporalis and masseter strength intact. CN VII : muscles of facial expression intact. CN X :  midline uvula. XI strength of sternocleidomastoid and trapezius muscles 5/5, XII: tongue is midline when protruded.  - Motor: No involuntary movements. Muscle tone and bulk normal throughout. Muscle strength is 5/5 in bilateral shoulder abduction, elbow flexion and extension, wrist flexion and extension, thumb opposition, grip, 5/5 right/4/5 left hip Flexion, ankle dorsiflexion and plantar  flexion.   - Sensory: Proprioception, light tough sensation intact in all extremities.    - Cerebellar: rapid alternating movements and point to point movement intact in upper and lower extremities. Normal stance and gait. Negative romberg or pronator drift.  Skin: Skin is warm and dry. No rash noted. He is not diaphoretic. No erythema. No pallor.  Psychiatric: He has a normal mood and affect. His behavior is normal.  Nursing note and vitals reviewed.    ED Treatments / Results  Labs (all labs ordered are listed, but only abnormal results are displayed) Labs Reviewed  BASIC METABOLIC PANEL - Abnormal; Notable for the following:       Result Value   Sodium 134 (*)    Potassium 3.4 (*)    All other components within normal limits  CBC - Abnormal; Notable for the following:    WBC 14.2 (*)    MCHC 36.1 (*)    All other components within normal limits  RAPID URINE DRUG SCREEN, HOSP PERFORMED - Abnormal; Notable for the following:     Benzodiazepines POSITIVE (*)    All other components within normal limits  CBG MONITORING, ED - Abnormal; Notable for the following:    Glucose-Capillary 102 (*)    All other components within normal limits  ETHANOL  URINALYSIS, ROUTINE W REFLEX MICROSCOPIC  CK    EKG  EKG Interpretation  Date/Time:  Friday January 09 2017 12:11:54 EDT Ventricular Rate:  92 PR Interval:    QRS Duration: 112 QT Interval:  451 QTC Calculation: 558 R Axis:   3 Text Interpretation:  Sinus rhythm Diffuse non-specific changes, non-ischemic Otherwise normal EKG QT calculated as prolonged but <500 when directly measured Confirmed by Erma Heritage MD, [place] [phone]) on 01/09/2017 3:41:01 PM       Radiology Dg Chest 2 View  Result Date: 01/09/2017 CLINICAL DATA:  New onset seizure. EXAM: CHEST  2 VIEW COMPARISON:  CT 01/04/2012 FINDINGS: Mediastinum and hilar structures are normal. Low lung volumes with mild bibasilar atelectasis. No pleural effusion or pneumothorax. Heart size normal. Lung volumes with mild bibasilar atelectasis. IMPRESSION: No active cardiopulmonary disease. Electronically Signed   By: Maisie Fus  Register   On: 01/09/2017 12:00   Ct Head Wo Contrast  Result Date: 01/09/2017 CLINICAL DATA:  Possible seizure. EXAM: CT HEAD WITHOUT CONTRAST TECHNIQUE: Contiguous axial images were obtained from the base of the skull through the vertex without intravenous contrast. COMPARISON:  None. FINDINGS: Brain: There is no evidence for acute hemorrhage, hydrocephalus, mass lesion, or abnormal extra-axial fluid collection. No definite CT evidence for acute infarction. Vascular: No hyperdense vessel or unexpected calcification. Skull: Normal. Negative for fracture or focal lesion. Sinuses/Orbits: No acute finding. Other: None. IMPRESSION: Normal CT evaluation of the brain. Electronically Signed   By: Kennith Center M.D.   On: 01/09/2017 12:10    Procedures Procedures (including critical care time)  Medications Ordered  in ED Medications  LORazepam (ATIVAN) injection 1 mg (1 mg Intravenous Given 01/09/17 1225)  sodium chloride 0.9 % bolus 1,000 mL (0 mLs Intravenous Stopped 01/09/17 1357)  magic mouthwash (2 mLs Oral Given 01/09/17 1403)  HYDROcodone-acetaminophen (NORCO/VICODIN) 5-325 MG per tablet 1 tablet (1 tablet Oral Given 01/09/17 1402)  HYDROcodone-acetaminophen (NORCO/VICODIN) 5-325 MG per tablet 1 tablet (1 tablet Oral Given 01/09/17 1515)     Initial Impression / Assessment and Plan / ED Course  I have reviewed the triage vital signs and the nursing notes.  Pertinent labs & imaging results that were available  during my care of the patient were reviewed by me and considered in my medical decision making (see chart for details).      Patient presents with new onset seizure-like activity. Noncompliant with clonazepam for the last 2 days. He is typically on 2 mg a day. After calling his PCP he did take 1 mg of clonazepam prior to arrival in ED. Given 1 mg Ativan.  On exam he has left lower extremity weakness and evidence of tongue biting. He was otherwise alert and oriented with a normal neuro exam.  Labs obtained were unremarkable and CT head negative. 12:15-- spoke to Neurology and they will see him in office.  On reassessment, he still had difficulty lifting his legs due to pain, but had 5/5 resisted strength. Equal strength bilaterally.  Patient refused toradol as he has had stomach issues in the past (flaring IBS). He states that he has taken vicodin in the past without any issues. Ordered magic mouthwash for his tongue tenderness.  On reassessment, after pain management, patient reported significant improvement. He had returned to baseline and was observed walking without difficulties.   Discharge home with close follow up with Neurology and PCP. Patient stated that he can talk to his PCP today and have the prescription for benzo called in today. Patient's vitals were stable. He is well appearing  and non-toxic. Reassuring neuro exam. Discussed strict return precautions and advised to return to the emergency department if experiencing any new or worsening symptoms. Instructions were understood and patient agreed with discharge plan. Patient was discussed with Dr. Erma Heritage who agrees with assessment and plan.  Final Clinical Impressions(s) / ED Diagnoses   Final diagnoses:  Seizure [facility])    New Prescriptions New Prescriptions   No medications on file     Georgiana Shore, Cordelia Poche 01/09/17 1602    Shaune Pollack, MD 01/10/17 1242

## 2017-01-09 NOTE — Discharge Instructions (Signed)
As discussed, follow up with the Neurology office listed above. Follow up with your primary care provider for medication refill. Return to the emergency department if you experience another episode, become confused, dizzy, nausea/vomiting or any other new concerning symptoms.

## 2017-10-22 ENCOUNTER — Encounter (HOSPITAL_COMMUNITY): Payer: Self-pay | Admitting: Emergency Medicine

## 2017-10-22 ENCOUNTER — Emergency Department (HOSPITAL_COMMUNITY)
Admission: EM | Admit: 2017-10-22 | Discharge: 2017-10-22 | Disposition: A | Discharge status: Rehabilitation Facility | Payer: Self-pay | Attending: Physician Assistant | Admitting: Physician Assistant

## 2017-10-22 DIAGNOSIS — F17290 Nicotine dependence, other tobacco product, uncomplicated: Secondary | ICD-10-CM | POA: Insufficient documentation

## 2017-10-22 DIAGNOSIS — Z96651 Presence of right artificial knee joint: Secondary | ICD-10-CM | POA: Insufficient documentation

## 2017-10-22 DIAGNOSIS — R441 Visual hallucinations: Secondary | ICD-10-CM | POA: Insufficient documentation

## 2017-10-22 DIAGNOSIS — R6889 Other general symptoms and signs: Secondary | ICD-10-CM | POA: Insufficient documentation

## 2017-10-22 DIAGNOSIS — Z79899 Other long term (current) drug therapy: Secondary | ICD-10-CM | POA: Insufficient documentation

## 2017-10-22 DIAGNOSIS — F1910 Other psychoactive substance abuse, uncomplicated: Secondary | ICD-10-CM | POA: Insufficient documentation

## 2017-10-22 DIAGNOSIS — R443 Hallucinations, unspecified: Secondary | ICD-10-CM

## 2017-10-22 LAB — ACETAMINOPHEN LEVEL

## 2017-10-22 LAB — RAPID URINE DRUG SCREEN, HOSP PERFORMED
Amphetamines: NOT DETECTED
BARBITURATES: NOT DETECTED
Benzodiazepines: POSITIVE — AB
Cocaine: NOT DETECTED
Opiates: NOT DETECTED
Tetrahydrocannabinol: POSITIVE — AB

## 2017-10-22 LAB — SALICYLATE LEVEL: Salicylate Lvl: <7 mg/dL (ref 2.8–30.0)

## 2017-10-22 LAB — CBC
HCT: 39.6 % (ref 39.0–52.0)
Hemoglobin: 13.4 g/dL (ref 13.0–17.0)
MCH: 31.5 pg (ref 26.0–34.0)
MCHC: 33.8 g/dL (ref 30.0–36.0)
MCV: 93 fL (ref 78.0–100.0)
PLATELETS: 235 10*3/uL (ref 150–400)
RBC: 4.26 MIL/uL (ref 4.22–5.81)
RDW: 12.5 % (ref 11.5–15.5)
WBC: 6.9 10*3/uL (ref 4.0–10.5)

## 2017-10-22 LAB — COMPREHENSIVE METABOLIC PANEL
ALBUMIN: 4.2 g/dL (ref 3.5–5.0)
ALK PHOS: 48 U/L (ref 38–126)
ALT: 28 U/L (ref 17–63)
AST: 17 U/L (ref 15–41)
Anion gap: 5 (ref 5–15)
BILIRUBIN TOTAL: 0.6 mg/dL (ref 0.3–1.2)
BUN: 10 mg/dL (ref 6–20)
CALCIUM: 9.3 mg/dL (ref 8.9–10.3)
CO2: 29 mmol/L (ref 22–32)
Chloride: 106 mmol/L (ref 101–111)
Creatinine, Ser: 0.99 mg/dL (ref 0.61–1.24)
GFR calc Af Amer: >60 mL/min (ref >60)
GFR calc non Af Amer: >60 mL/min (ref >60)
GLUCOSE: 87 mg/dL (ref 65–99)
Potassium: 4.3 mmol/L (ref 3.5–5.1)
Sodium: 140 mmol/L (ref 135–145)
TOTAL PROTEIN: 7.1 g/dL (ref 6.5–8.1)

## 2017-10-22 LAB — ETHANOL: Alcohol, Ethyl (B): <10 mg/dL (ref <10)

## 2017-10-22 LAB — LIPASE, BLOOD: LIPASE: 29 U/L (ref 11–51)

## 2017-10-22 MED ORDER — SODIUM CHLORIDE 0.9 % IV BOLUS (SEPSIS)
1000.0000 mL | Freq: Once | INTRAVENOUS | Status: AC
  Administered 2017-10-22: 1000 mL via INTRAVENOUS

## 2017-10-22 MED ORDER — LORAZEPAM 1 MG PO TABS: 0.0000 mg | ORAL_TABLET | Freq: Four times a day (QID) | ORAL | Status: DC

## 2017-10-22 MED ORDER — LORAZEPAM 1 MG PO TABS: 0.0000 mg | ORAL_TABLET | Freq: Two times a day (BID) | ORAL | Status: DC

## 2017-10-22 MED ORDER — LORAZEPAM 2 MG/ML IJ SOLN: 0.0000 mg | Freq: Two times a day (BID) | INTRAMUSCULAR | Status: DC

## 2017-10-22 MED ORDER — THIAMINE HCL 100 MG/ML IJ SOLN: 100.0000 mg | Freq: Every day | INTRAMUSCULAR | Status: DC

## 2017-10-22 MED ORDER — VITAMIN B-1 100 MG PO TABS
100.0000 mg | ORAL_TABLET | Freq: Every day | ORAL | Status: DC
  Administered 2017-10-22: 100 mg via ORAL
  Filled 2017-10-22: qty 1

## 2017-10-22 MED ORDER — LORAZEPAM 2 MG/ML IJ SOLN
0.0000 mg | Freq: Four times a day (QID) | INTRAMUSCULAR | Status: DC
  Administered 2017-10-22: 1 mg via INTRAVENOUS
  Filled 2017-10-22: qty 1

## 2017-10-22 NOTE — ED Provider Notes (Signed)
[facility]-EMERGENCY DEPT Provider Note   CSN: [id number] Arrival date & time: 10/22/17  1243     History   Chief Complaint Chief Complaint  Patient presents with  . Anxiety  . Hallucinations    HPI Ronald Homes. is a 32 y.o. male who presents today for evaluation of hallucinations.  He reports that he was at [facility] for medical detox from opioids, alcohol, marijuana, benzos.  He reports that he was in a class at [facility] when he reportedly saw a large spider on his arm asked the person sitting next to him to kill it.  He reports that he was told that the spider was not really there.  He then presented to the nurse and was given 10 mg Zyprexa at 0950, as he was very anxious.  He was then given Librium 25mg  at 1020, followed by valium IM 10mg  at 1100. He reports that since then he feels like things are still crawling on him and went as far as to change his clothes as he felt there were spiders in them.  He reports that since yesterday he thinks he has been hearing things others aren't hearing and that right now it looks like the ceiling is wavey.  He denies any ingestions other than what he was given by fellowship staff.  He was reportedly admitted at fellowship on the 13th. He reports he has body wide joint aches.    HPI  Past Medical History:  Diagnosis Date  . Acne   . Calcium nephrolithiasis    has residual stone March '13  . Chronic anxiety   . DJD of shoulder    with h/o sublaxation  . IBS (irritable bowel syndrome)   . Pancreatitis   . Substance abuse [facility])     Patient Active Problem List   Diagnosis Date Noted  . Anxiety neurosis 02/20/2011  . RECTAL BLEEDING 05/10/2010  . RECTAL PAIN 05/10/2010  . WEIGHT LOSS-ABNORMAL 05/10/2010  . ABDOMINAL PAIN, LOWER 05/10/2010  . WEIGHT GAIN 10/30/2009  . INCREASED BLOOD PRESSURE 10/30/2009  . ABNORMAL TRANSAMINASE-LFT'S 06/26/2008  . DIARRHEA, CHRONIC 03/10/2008  . EPIGASTRIC PAIN  03/10/2008  . GERD 01/03/2008  . IBS 01/03/2008  . 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 KNEE ARTHROSCOPY     Right  . NEPHROSTOMY TRACT DILATATION W/ LITHOTRIPSY     for nephrolithiasis  . OPEN ANTERIOR SHOULDER RECONSTRUCTION     for sublaxation  . SHOULDER SURGERY  02/12/2010   Left       Home Medications    Prior to Admission medications   Medication Sig Start Date End Date Taking? Authorizing Provider  carbamazepine (TEGRETOL) 200 MG tablet Take 200 mg by mouth 2 (two) times daily.   Yes [provider]  chlordiazePOXIDE (LIBRIUM) 25 MG capsule Take 25 mg by mouth 4 (four) times daily.   Yes [provider]  cloNIDine (CATAPRES - DOSED IN MG/24 HR) 0.2 mg/24hr patch Place 0.2 mg onto the skin once a week.   Yes [provider]  diazepam (VALIUM) 5 MG/ML injection Inject 10 mg into the vein daily as needed (seizures).   Yes [provider]  dicyclomine (BENTYL) 20 MG tablet Take 20 mg by mouth 4 (four) times daily as needed (GI cramping).   Yes [provider]  FLUoxetine (PROZAC) 20 MG capsule Take 60 mg by mouth at bedtime.   Yes [provider]  fluticasone (FLONASE) 50 MCG/ACT nasal spray Place 2 sprays into the nose daily. Patient taking differently: Place 2 sprays into the nose 2 (two) times daily as needed.  06/29/12  Yes Newt LukesLeschber, Valerie A, MD  Fructose-Dextrose-Phosphor Acd (NAUSEA CONTROL) 1.87-1.87-21.5 SOLN Take 5-10 mLs by mouth every 15 (fifteen) minutes as needed (nausea).   Yes [provider]  levETIRAcetam (KEPPRA) 500 MG tablet Take 500 mg by mouth 2 (two) times daily.   Yes [provider]  Loperamide HCl (IMODIUM A-D PO) Take 2 tablets by mouth 4 (four) times daily as needed (diarrhea).   Yes [provider]  methocarbamol (ROBAXIN) 500 MG tablet Take 1,000 mg by mouth 4 (four) times daily as needed for muscle  spasms.   Yes [provider]  Multiple Vitamins-Minerals (MULTIVITAMIN ADULT PO) Take 1 tablet by mouth daily.   Yes [provider]  Omega-3 Fatty Acids (FISH OIL OMEGA-3) 1000 MG CAPS Take 3,000 mg by mouth daily.   Yes [provider]  ondansetron (ZOFRAN) 8 MG tablet Take 8 mg by mouth 2 (two) times daily as needed for nausea or vomiting.   Yes [provider]  phenylephrine-shark liver oil-mineral oil-petrolatum (PREPARATION H) 0.25-14-74.9 % rectal ointment Place 1 application rectally 4 (four) times daily as needed for hemorrhoids.   Yes [provider]  QUEtiapine (SEROQUEL) 50 MG tablet Take 50 mg by mouth daily.   Yes [provider]  traZODone (DESYREL) 100 MG tablet Take 100 mg by mouth at bedtime as needed for sleep.   Yes [provider]  amoxicillin-clavulanate (AUGMENTIN) 875-125 MG per tablet Take 1 tablet by mouth 2 (two) times daily. Patient not taking: Reported on 10/22/2017 09/21/13   Jacques NavyNorins, Michael E, MD  LORazepam (ATIVAN) 0.5 MG tablet TAKE ONE TABLET BY MOUTH EVERY 8 HOURS AS NEEDED FOR ANXIETY 08/08/13   Norins, Rosalyn GessMichael E, MD  sertraline (ZOLOFT) 100 MG tablet TAKE ONE-HALF TABLET BY MOUTH ONCE DAILY FOR 10 DAYS AND THEN ONE TABLET ONCE DAILY 02/21/14   Pecola LawlessHopper, William F, MD    Family History Family History  Problem Relation Age of Onset  . Pancreatic cancer Unknown   . Colitis Unknown   . Crohn's disease Mother     Social History Social History   Tobacco Use  . Smoking status: Former Smoker    Types: Cigarettes    Last attempt to quit: 10/06/2008    Years since quitting: 9.0  . Smokeless tobacco: Current User    Types: Snuff  . Tobacco comment: Age [age]  Substance Use Topics  . Alcohol use: Yes    Comment: occasional  . Drug use: No     Allergies   Cefprozil and Morphine sulfate   Review of Systems Review of Systems  Constitutional: Negative for chills and fever.  HENT: Negative for  ear pain and sore throat.   Eyes: Negative for pain and visual disturbance.  Respiratory: Negative for cough and shortness of breath.   Cardiovascular: Negative for chest pain and palpitations.  Gastrointestinal: Negative for abdominal pain and vomiting.  Genitourinary: Negative for dysuria and hematuria.  Musculoskeletal: Negative for arthralgias and back pain.  Skin: Negative for color change and rash.  Neurological: Negative for seizures and syncope.  Psychiatric/Behavioral: Positive for hallucinations. Negative for behavioral problems, confusion and suicidal ideas. The patient is nervous/anxious.   All other systems reviewed and are negative.    Physical Exam Updated Vital Signs BP (!) 101/47 (BP Location: Left Arm)  Pulse (!) 58   Temp 98.3 F (36.8 C) (Oral)   Resp 18   SpO2 99%   Physical Exam  Constitutional: He is oriented to person, place, and time. He appears well-developed and well-nourished.  HENT:  Head: Normocephalic and atraumatic.  Eyes: Conjunctivae are normal.  Neck: Normal range of motion. Neck supple.  Cardiovascular: Normal rate, regular rhythm and intact distal pulses.  No murmur heard. Pulmonary/Chest: Effort normal and breath sounds normal. No respiratory distress.  Abdominal: Soft. Bowel sounds are normal. He exhibits no distension. There is no tenderness.  Musculoskeletal: He exhibits no edema.  Neurological: He is alert and oriented to person, place, and time.  Skin: Skin is warm and dry.  Psychiatric: His mood appears anxious. He is actively hallucinating.  Nursing note and vitals reviewed.    ED Treatments / Results  Labs (all labs ordered are listed, but only abnormal results are displayed) Labs Reviewed  RAPID URINE DRUG SCREEN, HOSP PERFORMED - Abnormal; Notable for the following components:      Result Value   Benzodiazepines POSITIVE (*)    Tetrahydrocannabinol POSITIVE (*)    All other components within normal limits    ACETAMINOPHEN LEVEL - Abnormal; Notable for the following components:   Acetaminophen (Tylenol), Serum <10 (*)    All other components within normal limits  COMPREHENSIVE METABOLIC PANEL  ETHANOL  SALICYLATE LEVEL  LIPASE, BLOOD  CBC    EKG  EKG Interpretation  Date/Time:  Thursday October 22 2017 15:09:59 EST Ventricular Rate:  57 PR Interval:  150 QRS Duration: 110 QT Interval:  444 QTC Calculation: 432 R Axis:   37 Text Interpretation:  Sinus bradycardia Otherwise normal ECG Confirmed by Lorre Nick ([id number]) on 10/22/2017 3:30:50 PM       Radiology No results found.  Procedures Procedures (including critical care time)  Medications Ordered in ED Medications  sodium chloride 0.9 % bolus 1,000 mL (0 mLs Intravenous Stopped 10/22/17 1923)     Initial Impression / Assessment and Plan / ED Course  I have reviewed the triage vital signs and the nursing notes.  Pertinent labs & imaging results that were available during my care of the patient were reviewed by me and considered in my medical decision making (see chart for details).  Clinical Course as of Oct 23 112  Thu Oct 22, 2017  1439 Opiate use, alcohol, benzos,  Seeing spiders, CIWA 30, Given zyprexa 10 at 0950, Librium 25 at 1020.  Valium IM around 1100.   [EH]  1700 Patient reassessed, wakes up to voice, in no distress.   [EH]  6045 [phone]- [facility].   [EH]    Clinical Course User Index [EH] Cristina Gong, PA-C   Ronald Joe. presents today from [facility] for evaluation of visual and tactile hallucinations along with anxiety.  He is currently being treated for polysubstance withdrawal.  Prior to arrival he was given Zyprexa, Librium, and Valium.  Upon arrival CIWA score was evaluated he was given 1 mg Ativan.  Labs were obtained, without obvious physical cause for his symptoms.  He was observed for multiple hours in the emergency room, and after the dose of Ativan did not  have any additional hallucinations.  His anxiety was greatly improved.  I spoke with medical staff at [facility] and patient will be transported back there upon discharge.  Patiently medically stable, with low CIWA scores, I feel that he is medically stable for discharge to [facility] for continued medical detox at this time.  This patient was seen as a shared visit with Dr. Freida Busman who evaluated the patient and agreed with my plan.   Final Clinical Impressions(s) / ED Diagnoses   Final diagnoses:  Hallucinations  Polysubstance abuse [facility])  Withdrawal complaint    ED Discharge Orders    None       Cristina Gong, [place] 10/23/17 0119

## 2017-10-22 NOTE — ED Triage Notes (Addendum)
Per EMS, patient coming from [facility], c/o anxiety x1 hour. Patient hallucinating, reports seeing spiders on his arms. Last alcohol x4 days ago. Cooperative. Patient currently being treated for alcohol withdrawal with zyprexa, librium, and valium. Denies SI/HI.

## 2017-10-22 NOTE — ED Notes (Signed)
Got report from [facility] Charge RN stated that paper works has been received. Asked if patient's mom sitting with patient can transport patient to the facility if not, they can arrange transport for the patient. Patient's mom accepted to transfer patient.

## 2017-10-22 NOTE — ED Notes (Addendum)
Patient left the unit in care of mom who transported [facility]. Patient ambulatory and stable. Patient has no belongings. After Visit summary explained to patient and questions answered.

## 2017-10-22 NOTE — Discharge Instructions (Signed)
[facility] to [facility].

## 2017-10-22 NOTE — ED Notes (Signed)
Patient wanded by security. 

## 2017-10-22 NOTE — ED Provider Notes (Signed)
Medical screening examination/treatment/procedure(s) were conducted as a shared visit with non-physician practitioner(s) and myself.  I personally evaluated the patient during the encounter.   EKG Interpretation  Date/Time:  Thursday October 22 2017 15:09:59 EST Ventricular Rate:  57 PR Interval:  150 QRS Duration: 110 QT Interval:  444 QTC Calculation: 432 R Axis:   37 Text Interpretation:  Sinus bradycardia Otherwise normal ECG Confirmed by Lorre NickAllen, Zacchary Pompei ([id number]) on 10/22/2017 3:30:7650 PM     32 year old male who is currently inpatient for detox from polysubstance abuse.  Has had increased anxiety with some tactile hallucinations as well as visual hallucinations consisting of garden spiders.  No suicidal or homicidal ideations.  Was given Valium and does feel much better at this time.  Here he is alert and oriented x4.  Spoke with patient's mother who states that he is at his baseline.  Does not appear to be confused to her.  Patient's vital signs are stable.  Will check labs and if normal will have physician assistant contact medical personnel at [facility] and likely transfer back   Lorre NickAllen, Pierre Cumpton, MD 10/22/17 [phone]

## 2018-01-27 ENCOUNTER — Other Ambulatory Visit: Payer: Self-pay | Admitting: Urology

## 2018-01-27 ENCOUNTER — Ambulatory Visit (HOSPITAL_COMMUNITY): Payer: 59 | Admitting: Anesthesiology

## 2018-01-27 ENCOUNTER — Encounter (HOSPITAL_COMMUNITY): Payer: Self-pay | Admitting: Anesthesiology

## 2018-01-27 ENCOUNTER — Ambulatory Visit (HOSPITAL_COMMUNITY)
Admission: RE | Admit: 2018-01-27 | Discharge: 2018-01-27 | Disposition: A | Discharge status: Home / Self Care | Payer: 59 | Source: Ambulatory Visit | Attending: Urology | Admitting: Urology

## 2018-01-27 ENCOUNTER — Encounter (HOSPITAL_COMMUNITY): Admission: RE | Disposition: A | Discharge status: Home / Self Care | Payer: Self-pay | Source: Ambulatory Visit

## 2018-01-27 ENCOUNTER — Ambulatory Visit (HOSPITAL_COMMUNITY): Payer: 59

## 2018-01-27 DIAGNOSIS — M1990 Unspecified osteoarthritis, unspecified site: Secondary | ICD-10-CM | POA: Insufficient documentation

## 2018-01-27 DIAGNOSIS — F419 Anxiety disorder, unspecified: Secondary | ICD-10-CM | POA: Insufficient documentation

## 2018-01-27 DIAGNOSIS — R319 Hematuria, unspecified: Secondary | ICD-10-CM | POA: Diagnosis not present

## 2018-01-27 DIAGNOSIS — E669 Obesity, unspecified: Secondary | ICD-10-CM | POA: Diagnosis not present

## 2018-01-27 DIAGNOSIS — N201 Calculus of ureter: Secondary | ICD-10-CM | POA: Diagnosis not present

## 2018-01-27 DIAGNOSIS — K589 Irritable bowel syndrome without diarrhea: Secondary | ICD-10-CM | POA: Insufficient documentation

## 2018-01-27 DIAGNOSIS — Z888 Allergy status to other drugs, medicaments and biological substances status: Secondary | ICD-10-CM | POA: Insufficient documentation

## 2018-01-27 DIAGNOSIS — Z87891 Personal history of nicotine dependence: Secondary | ICD-10-CM | POA: Diagnosis not present

## 2018-01-27 DIAGNOSIS — Z79899 Other long term (current) drug therapy: Secondary | ICD-10-CM | POA: Insufficient documentation

## 2018-01-27 DIAGNOSIS — R109 Unspecified abdominal pain: Secondary | ICD-10-CM | POA: Diagnosis present

## 2018-01-27 DIAGNOSIS — K219 Gastro-esophageal reflux disease without esophagitis: Secondary | ICD-10-CM | POA: Insufficient documentation

## 2018-01-27 DIAGNOSIS — Z885 Allergy status to narcotic agent status: Secondary | ICD-10-CM | POA: Diagnosis not present

## 2018-01-27 DIAGNOSIS — Z87442 Personal history of urinary calculi: Secondary | ICD-10-CM | POA: Insufficient documentation

## 2018-01-27 DIAGNOSIS — R112 Nausea with vomiting, unspecified: Secondary | ICD-10-CM | POA: Insufficient documentation

## 2018-01-27 DIAGNOSIS — Z6829 Body mass index (BMI) 29.0-29.9, adult: Secondary | ICD-10-CM | POA: Insufficient documentation

## 2018-01-27 HISTORY — PX: HOLMIUM LASER APPLICATION: SHX5852

## 2018-01-27 HISTORY — PX: CYSTOSCOPY/RETROGRADE/URETEROSCOPY/STONE EXTRACTION WITH BASKET: SHX5317

## 2018-01-27 LAB — BASIC METABOLIC PANEL
ANION GAP: 9 (ref 5–15)
BUN: 14 mg/dL (ref 6–20)
CO2: 23 mmol/L (ref 22–32)
CREATININE: 1 mg/dL (ref 0.61–1.24)
Calcium: 9.3 mg/dL (ref 8.9–10.3)
Chloride: 107 mmol/L (ref 101–111)
GFR calc Af Amer: >60 mL/min (ref >60)
GLUCOSE: 93 mg/dL (ref 65–99)
Potassium: 4.2 mmol/L (ref 3.5–5.1)
Sodium: 139 mmol/L (ref 135–145)

## 2018-01-27 SURGERY — CYSTOSCOPY, WITH CALCULUS REMOVAL USING BASKET
Anesthesia: General | Site: Ureter | Laterality: Right

## 2018-01-27 MED ORDER — MIDAZOLAM HCL 2 MG/2ML IJ SOLN
INTRAMUSCULAR | Status: AC
  Filled 2018-01-27: qty 2

## 2018-01-27 MED ORDER — FENTANYL CITRATE (PF) 100 MCG/2ML IJ SOLN
INTRAMUSCULAR | Status: AC
  Filled 2018-01-27: qty 2

## 2018-01-27 MED ORDER — HYDROMORPHONE HCL 1 MG/ML IJ SOLN
INTRAMUSCULAR | Status: AC
  Administered 2018-01-27: 0.5 mg via INTRAVENOUS
  Filled 2018-01-27: qty 1

## 2018-01-27 MED ORDER — MIDAZOLAM HCL 5 MG/5ML IJ SOLN
INTRAMUSCULAR | Status: DC | PRN
  Administered 2018-01-27: 2 mg via INTRAVENOUS

## 2018-01-27 MED ORDER — PHENAZOPYRIDINE HCL 200 MG PO TABS
ORAL_TABLET | ORAL | Status: AC
  Administered 2018-01-27: 200 mg via ORAL
  Filled 2018-01-27: qty 1

## 2018-01-27 MED ORDER — ROCURONIUM BROMIDE 10 MG/ML (PF) SYRINGE
PREFILLED_SYRINGE | INTRAVENOUS | Status: AC
  Filled 2018-01-27: qty 5

## 2018-01-27 MED ORDER — PROMETHAZINE HCL 25 MG/ML IJ SOLN: 6.2500 mg | INTRAMUSCULAR | Status: DC | PRN

## 2018-01-27 MED ORDER — PHENAZOPYRIDINE HCL 200 MG PO TABS: 200.0000 mg | ORAL_TABLET | Freq: Three times a day (TID) | ORAL | 0 refills | Status: AC | PRN

## 2018-01-27 MED ORDER — SUCCINYLCHOLINE CHLORIDE 200 MG/10ML IV SOSY
PREFILLED_SYRINGE | INTRAVENOUS | Status: AC
  Filled 2018-01-27: qty 10

## 2018-01-27 MED ORDER — LIDOCAINE 2% (20 MG/ML) 5 ML SYRINGE
INTRAMUSCULAR | Status: DC | PRN
  Administered 2018-01-27: 75 mg via INTRAVENOUS

## 2018-01-27 MED ORDER — CEFAZOLIN SODIUM-DEXTROSE 2-4 GM/100ML-% IV SOLN: 2.0000 g | Freq: Once | INTRAVENOUS | Status: DC

## 2018-01-27 MED ORDER — LIDOCAINE 2% (20 MG/ML) 5 ML SYRINGE
INTRAMUSCULAR | Status: AC
  Filled 2018-01-27: qty 5

## 2018-01-27 MED ORDER — PROPOFOL 10 MG/ML IV BOLUS
INTRAVENOUS | Status: DC | PRN
  Administered 2018-01-27: 100 mg via INTRAVENOUS
  Administered 2018-01-27: 200 mg via INTRAVENOUS

## 2018-01-27 MED ORDER — PHENAZOPYRIDINE HCL 200 MG PO TABS
ORAL_TABLET | ORAL | Status: AC
  Filled 2018-01-27: qty 1

## 2018-01-27 MED ORDER — ACETAMINOPHEN 10 MG/ML IV SOLN
INTRAVENOUS | Status: AC
  Filled 2018-01-27: qty 100

## 2018-01-27 MED ORDER — LACTATED RINGERS IV SOLN
Freq: Once | INTRAVENOUS | Status: AC
  Administered 2018-01-27: 18:00:00 via INTRAVENOUS

## 2018-01-27 MED ORDER — DEXAMETHASONE SODIUM PHOSPHATE 10 MG/ML IJ SOLN
INTRAMUSCULAR | Status: AC
  Filled 2018-01-27: qty 1

## 2018-01-27 MED ORDER — ONDANSETRON HCL 4 MG/2ML IJ SOLN
INTRAMUSCULAR | Status: DC | PRN
  Administered 2018-01-27: 4 mg via INTRAVENOUS

## 2018-01-27 MED ORDER — CEFAZOLIN SODIUM-DEXTROSE 2-4 GM/100ML-% IV SOLN
INTRAVENOUS | Status: AC
  Filled 2018-01-27: qty 100

## 2018-01-27 MED ORDER — FENTANYL CITRATE (PF) 100 MCG/2ML IJ SOLN
100.0000 ug | INTRAMUSCULAR | Status: AC
  Administered 2018-01-27: 100 ug via INTRAVENOUS

## 2018-01-27 MED ORDER — PROPOFOL 10 MG/ML IV BOLUS
INTRAVENOUS | Status: AC
  Filled 2018-01-27: qty 20

## 2018-01-27 MED ORDER — HYDROMORPHONE HCL 1 MG/ML IJ SOLN
0.2500 mg | INTRAMUSCULAR | Status: DC | PRN
  Administered 2018-01-27 (×4): 0.5 mg via INTRAVENOUS

## 2018-01-27 MED ORDER — MEPERIDINE HCL 50 MG/ML IJ SOLN: 6.2500 mg | INTRAMUSCULAR | Status: DC | PRN

## 2018-01-27 MED ORDER — FENTANYL CITRATE (PF) 250 MCG/5ML IJ SOLN
INTRAMUSCULAR | Status: DC | PRN
  Administered 2018-01-27: 100 ug via INTRAVENOUS

## 2018-01-27 MED ORDER — SUCCINYLCHOLINE CHLORIDE 200 MG/10ML IV SOSY
PREFILLED_SYRINGE | INTRAVENOUS | Status: DC | PRN
  Administered 2018-01-27: 120 mg via INTRAVENOUS

## 2018-01-27 MED ORDER — HYDROCODONE-ACETAMINOPHEN 7.5-325 MG PO TABS: 1.0000 | ORAL_TABLET | Freq: Once | ORAL | Status: DC | PRN

## 2018-01-27 MED ORDER — DEXMEDETOMIDINE HCL IN NACL 200 MCG/50ML IV SOLN
INTRAVENOUS | Status: DC | PRN
  Administered 2018-01-27: 8 ug via INTRAVENOUS
  Administered 2018-01-27: 20 ug via INTRAVENOUS

## 2018-01-27 MED ORDER — LACTATED RINGERS IV SOLN
INTRAVENOUS | Status: DC | PRN
  Administered 2018-01-27: 19:00:00 via INTRAVENOUS

## 2018-01-27 MED ORDER — PHENAZOPYRIDINE HCL 200 MG PO TABS
200.0000 mg | ORAL_TABLET | Freq: Once | ORAL | Status: AC
  Administered 2018-01-27: 200 mg via ORAL

## 2018-01-27 MED ORDER — CIPROFLOXACIN IN D5W 400 MG/200ML IV SOLN
400.0000 mg | Freq: Once | INTRAVENOUS | Status: AC
  Administered 2018-01-27: 400 mg via INTRAVENOUS
  Filled 2018-01-27: qty 200

## 2018-01-27 MED ORDER — ONDANSETRON HCL 4 MG/2ML IJ SOLN
INTRAMUSCULAR | Status: AC
  Filled 2018-01-27: qty 2

## 2018-01-27 MED ORDER — SODIUM CHLORIDE 0.9 % IR SOLN
Status: DC | PRN
  Administered 2018-01-27: 3000 mL

## 2018-01-27 MED ORDER — DEXAMETHASONE SODIUM PHOSPHATE 10 MG/ML IJ SOLN
INTRAMUSCULAR | Status: DC | PRN
  Administered 2018-01-27: 5 mg via INTRAVENOUS

## 2018-01-27 MED ORDER — IOHEXOL 300 MG/ML  SOLN
INTRAMUSCULAR | Status: DC | PRN
  Administered 2018-01-27: 7 mL via INTRAVENOUS

## 2018-01-27 MED ORDER — ACETAMINOPHEN 10 MG/ML IV SOLN
1000.0000 mg | Freq: Once | INTRAVENOUS | Status: AC
  Administered 2018-01-27: 1000 mg via INTRAVENOUS

## 2018-01-27 SURGICAL SUPPLY — 21 items
BAG URO CATCHER STRL LF (MISCELLANEOUS) ×2 IMPLANT
BASKET ZERO TIP NITINOL 2.4FR (BASKET) IMPLANT
CATH INTERMIT  6FR 70CM (CATHETERS) ×2 IMPLANT
CLOTH BEACON ORANGE TIMEOUT ST (SAFETY) ×2 IMPLANT
COVER FOOTSWITCH UNIV (MISCELLANEOUS) ×2 IMPLANT
COVER SURGICAL LIGHT HANDLE (MISCELLANEOUS) ×2 IMPLANT
EXTRACTOR STONE NITINOL NGAGE (UROLOGICAL SUPPLIES) ×2 IMPLANT
FIBER LASER FLEXIVA 365 (UROLOGICAL SUPPLIES) ×2 IMPLANT
FIBER LASER TRAC TIP (UROLOGICAL SUPPLIES) IMPLANT
GLOVE BIOGEL M STRL SZ7.5 (GLOVE) ×2 IMPLANT
GOWN STRL REUS W/TWL LRG LVL3 (GOWN DISPOSABLE) ×4 IMPLANT
GUIDEWIRE ANG ZIPWIRE 038X150 (WIRE) IMPLANT
GUIDEWIRE STR DUAL SENSOR (WIRE) ×4 IMPLANT
IV NS 1000ML (IV SOLUTION) ×1
IV NS 1000ML BAXH (IV SOLUTION) ×1 IMPLANT
MANIFOLD NEPTUNE II (INSTRUMENTS) ×2 IMPLANT
PACK CYSTO (CUSTOM PROCEDURE TRAY) ×2 IMPLANT
SHEATH URETERAL 12FRX35CM (MISCELLANEOUS) IMPLANT
STENT URET 6FRX26 CONTOUR (STENTS) ×2 IMPLANT
TUBING CONNECTING 10 (TUBING) ×2 IMPLANT
TUBING UROLOGY SET (TUBING) ×2 IMPLANT

## 2018-01-27 NOTE — H&P (Signed)
Office Visit Report     01/27/2018   --------------------------------------------------------------------------------   Ronald Daniel  MRN: [id number]  PRIMARY CARE:  Illene Regulus, MD  DOB: January 02, 1986, 32 year old Male  REFERRING:    SSN:-**[id number]  PROVIDER:  Heloise Purpura, M.D.    LOCATION:  [facility] [phone]   --------------------------------------------------------------------------------   CC/HPI: Gross hematuria and left flank pain   Ronald Daniel is a 32 year old gentleman with a long history of urolithiasis previously treated by Dr. Isabel Caprice. He developed the acute onset of moderate to severe left-sided flank pain this morning associated with gross hematuria. He has had associated nausea and vomiting. He denies any fever. He has previously undergone shockwave lithotripsy but has never required ureteroscopy. His last stone episode was multiple years ago. He was seen by his primary care physician today who reportedly had performed a renal and bladder ultrasound that supposedly diagnosed a 7 mm stone. I do not have those records and not able to access the his films. In addition, Ronald Daniel is in recovery from both alcoholism and drug abuse.     ALLERGIES: cefprozil Cephalexin TABS Morphine Derivatives    MEDICATIONS: Bentyl 10 MG Oral Capsule Oral  Propranolol Hcl  Prozac  Seroquel  Trazodone Hcl     GU PSH: None     PSH Notes: Knee Surgery, Shoulder Surgery   NON-GU PSH: Knee Arthroscopy, Right Shoulder Arthroscopy/surgery, Right    GU PMH: Dysuria, Dysuria - 2014 History of urolithiasis, Nephrolithiasis - 2014 Renal calculus, Nephrolithiasis - 2014      PMH Notes:  1898-10-06 00:00:00 - Note: Normal Routine History And Physical Adult  2010-09-20 12:48:11 - Note: Urinary Calculus On The Left   NON-GU PMH: Irritable bowel syndrome with diarrhea, Irritable Bowel Syndrome - 2014 Anxiety Seizure disorder    FAMILY HISTORY: Crohn's Disease - Mother Death In  The Family Father - Runs In Family Family Health Status Of Mother - Alive 75 - Runs In Family Malignant Pancreatic Neoplasm - Father nephrolithiasis - Mother   SOCIAL HISTORY: Marital Status: Married Preferred Language: English; Ethnicity: Not Hispanic Or Latino; Race: White Current Smoking Status: Patient does not smoke anymore.   Tobacco Use Assessment Completed: Used Tobacco in last 30 days? Does not drink anymore.  Drinks 4+ caffeinated drinks per day.     Notes: Alcohol Use, Tobacco Use, Caffeine Use, Marital History - Single, Death In The Family Father, Occupation:   REVIEW OF SYSTEMS:    GU Review Male:   Patient reports frequent urination, hard to postpone urination, burning/ pain with urination, trouble starting your streams, and have to strain to urinate . Patient denies get up at night to urinate, leakage of urine, and stream starts and stops.  Gastrointestinal (Upper):   Patient reports nausea and vomiting.   Gastrointestinal (Lower):   Patient reports diarrhea. Patient denies constipation.  Constitutional:   Patient denies fever, night sweats, weight loss, and fatigue.  Skin:   Patient denies skin rash/ lesion and itching.  Eyes:   Patient denies blurred vision and double vision.  Ears/ Nose/ Throat:   Patient denies sore throat and sinus problems.  Hematologic/Lymphatic:   Patient denies swollen glands and easy bruising.  Cardiovascular:   Patient denies leg swelling and chest pains.  Respiratory:   Patient denies cough and shortness of breath.  Endocrine:   Patient denies excessive thirst.  Musculoskeletal:   Patient reports back pain. Patient denies joint pain.  Neurological:   Patient denies headaches and dizziness.  Psychologic:   Patient reports depression and anxiety.    VITAL SIGNS:      01/27/2018 02:54 PM  Weight 225 lb / 102.06 kg  Height 73 in / 185.42 cm  BP 103/65 mmHg  Pulse 73 /min  Temperature 97.8 F / 36.5 C  BMI 29.7 kg/m   MULTI-SYSTEM  PHYSICAL EXAMINATION:    Constitutional: Well-nourished. No physical deformities. Normally developed. Good grooming.  Neck: Neck symmetrical, not swollen. Normal tracheal position.  Respiratory: No labored breathing, no use of accessory muscles. Normal breath sounds.   Cardiovascular: Regular rate and rhythm. No murmur, no gallop. Normal temperature, normal extremity pulses, no swelling, no varicosities.  Lymphatic: No enlargement of neck, axillae, groin.  Skin: No paleness, no jaundice, no cyanosis. No lesion, no ulcer, no rash.  Neurologic / Psychiatric: Oriented to time, oriented to place, oriented to person. No depression, no anxiety, no agitation.  Gastrointestinal: Moderate left CVA tenderness.  Eyes: Normal conjunctivae. Normal eyelids.  Ears, Nose, Mouth, and Throat: Left ear no scars, no lesions, no masses. Right ear no scars, no lesions, no masses. Nose no scars, no lesions, no masses. Normal hearing. Normal lips.  Musculoskeletal: Normal gait and station of head and neck.     PAST DATA REVIEWED:  Source Of History:  Patient  Records Review:   Previous Patient Records  Urine Test Review:   Urinalysis  X-Ray Review: C.T. Abdomen/Pelvis: Reviewed Films.    Notes:                     I independently reviewed his CT scan. Interestingly, this appears to demonstrate 2 ureteral calculi. There do not appear to be any left-sided ureteral calculi.   PROCEDURES:         C.T. Urogram - [id number]               Urinalysis Dipstick Dipstick Cont'd Micro  Color: Yellow Bilirubin: Neg WBC/hpf: 0 - 5/hpf  Appearance: Cloudy Ketones: Neg RBC/hpf: 10 - 20/hpf  Specific Gravity: 1.025 Blood: 3+ Bacteria: NS (Not Seen)  pH: 5.5 Protein: Trace Cystals: NS (Not Seen)  Glucose: Neg Urobilinogen: 0.2 Casts: NS (Not Seen)    Nitrites: Neg Trichomonas: Not Present    Leukocyte Esterase: Neg Mucous: Present      Epithelial Cells: NS (Not Seen)      Yeast: NS (Not Seen)      Sperm: Not Present     ASSESSMENT:      ICD-10 Details  1 GU:   Flank Pain - R10.84   2   History of urolithiasis - Z87.442   3   Gross hematuria - R31.0    PLAN:           Orders X-Rays: C.T. Stone Protocol Without Contrast          Schedule Return Visit/Planned Activity: Other See Visit Notes             Note: To undergo surgery today          Document Letter(s):  Created for Patient: Clinical Summary         Notes:   1. Right ureteral calculi: We discussed the findings of his CT scan that are not consistent with his pain symptoms with regard to laterality. However, he does have some lower midline pelvic symptoms as well. He understands that his pain may not be related to his stones. Regardless, he does have 2 ureteral calculi and is concerned about being on narcotic  pain medication considering his history of narcotic abuse and alcoholism. We therefore discussed the risks and benefits of observation versus definitive treatment and specifically discussed ureteroscopic laser lithotripsy with possible stent placement. After reviewing the pros and cons of each approach, he does wish to proceed with surgical intervention this evening in order to avoid further pain medication if possible.   He will therefore be scheduled for cystoscopy, right retrograde pyelography, right ureteroscopy with laser lithotripsy and right ureteral stent placement. We have reviewed the potential risks, complications, and expected recovery process in detail. He gives informed consent to proceed.   Cc: Dr. Donnel Saxon

## 2018-01-27 NOTE — Anesthesia Preprocedure Evaluation (Addendum)
Anesthesia Evaluation  Patient identified by MRN, date of birth, ID band Patient awake    Reviewed: Allergy & Precautions, NPO status , Patient's Chart, lab work & pertinent test results  History of Anesthesia Complications (+) Emergence Delirium and history of anesthetic complications  Airway Mallampati: III  TM Distance: >3 FB Neck ROM: Full    Dental no notable dental hx. (+) Teeth Intact   Pulmonary former smoker,  Smokeless tobacco   Pulmonary exam normal breath sounds clear to auscultation       Cardiovascular negative cardio ROS Normal cardiovascular exam Rhythm:Regular Rate:Normal     Neuro/Psych Anxiety Hx/o PTSDnegative neurological ROS     GI/Hepatic GERD  Medicated and Controlled,(+)     substance abuse  alcohol use and marijuana use,   Endo/Other  Obesity  Renal/GU Renal diseaseRight ureteral calculus  negative genitourinary   Musculoskeletal  (+) Arthritis , Osteoarthritis,    Abdominal (+) + obese,   Peds  Hematology negative hematology ROS (+)   Anesthesia Other Findings   Reproductive/Obstetrics                           Anesthesia Physical Anesthesia Plan  ASA: II and emergent  Anesthesia Plan: General   Post-op Pain Management:    Induction: Intravenous and Cricoid pressure planned  PONV Risk Score and Plan: 4 or greater and Midazolam, Dexamethasone, Ondansetron and Treatment may vary due to age or medical condition  Airway Management Planned: Oral ETT  Additional Equipment:   Intra-op Plan:   Post-operative Plan: Extubation in OR  Informed Consent: I have reviewed the patients History and Physical, chart, labs and discussed the procedure including the risks, benefits and alternatives for the proposed anesthesia with the patient or authorized representative who has indicated his/her understanding and acceptance.   Dental advisory given  Plan Discussed  with: CRNA, Anesthesiologist and Surgeon  Anesthesia Plan Comments: (Precedex IV bolus)       Anesthesia Quick Evaluation

## 2018-01-27 NOTE — Discharge Instructions (Addendum)
1. You may see some blood in the urine and may have some burning with urination for 48-72 hours. You also may notice that you have to urinate more frequently or urgently after your procedure which is normal.  °2. You should call should you develop an inability urinate, fever > 101, persistent nausea and vomiting that prevents you from eating or drinking to stay hydrated.  °3. If you have a stent, you will likely urinate more frequently and urgently until the stent is removed and you may experience some discomfort/pain in the lower abdomen and flank especially when urinating. You may take pain medication prescribed to you if needed for pain. You may also intermittently have blood in the urine until the stent is removed. °4. You may remove your stent on Monday morning.  Simply pull the string that is taped to your body and the stent will easily come out.  This may be best done in the shower as some urine may come out with the stent.  Usually you will feel relief once the stent is removed, but occasionally patients can develop pain due to residual swelling of the ureter that may temporarily obstruct the kidney.  This can be managed by taking pain medication and it will typically resolve with time.  Please do not hesitate to call if you have pain that is not controlled with your pain medication or does not improved within 24-48 hours. ° °

## 2018-01-27 NOTE — Anesthesia Procedure Notes (Signed)
Procedure Name: Intubation Date/Time: 01/27/2018 6:50 PM Performed by: Elyn PeersAllen, Louella Medaglia J, CRNA Pre-anesthesia Checklist: Patient identified, Emergency Drugs available, Suction available, Patient being monitored and Timeout performed Patient Re-evaluated:Patient Re-evaluated prior to induction Oxygen Delivery Method: Circle system utilized Preoxygenation: Pre-oxygenation with 100% oxygen Induction Type: IV induction, Rapid sequence and Cricoid Pressure applied Laryngoscope Size: Miller and 3 Grade View: Grade I Tube type: Oral Tube size: 7.5 mm Number of attempts: 1 Airway Equipment and Method: Stylet Placement Confirmation: ETT inserted through vocal cords under direct vision,  positive ETCO2 and breath sounds checked- equal and bilateral Secured at: 23 cm Tube secured with: Tape Dental Injury: Teeth and Oropharynx as per pre-operative assessment

## 2018-01-27 NOTE — Op Note (Signed)
Preoperative diagnosis: Right ureteral calculus, left flank pain  Postoperative diagnosis: Right ureteral calculus, left flank pain  Procedure:  1. Cystoscopy 2. Right ureteroscopy and stone removal 3. Ureteroscopic laser lithotripsy 4. Right ureteral stent placement (6 x 26 with string) 5. Bilateral retrograde pyelography with interpretation  Surgeon: Moody BruinsLester S. Sire Poet, Jr. M.D.  Anesthesia: General  Complications: None  Intraoperative findings: Right retrograde pyelography demonstrated a filling defect within the mid ureter consistent with the patient's known calculus without other abnormalities. Left retrograde pyelography demonstrated no filling defects within the ureter or renal collecting system.  EBL: Minimal  Specimens: 1. Right ureteral calculus  Disposition of specimens: [facility] for stone analysis  Indication: Ronald JoeDale L Krigbaum Jr. is a 32 y.o. year old patient with urolithiasis. He presented with left flank pain but was found to have a right ureteral stone on CT imaging. After reviewing the management options for treatment, the patient elected to proceed with the above surgical procedure(s). We have discussed the potential benefits and risks of the procedure, side effects of the proposed treatment, the likelihood of the patient achieving the goals of the procedure, and any potential problems that might occur during the procedure or recuperation. Informed consent has been obtained.  Description of procedure:  The patient was taken to the operating room and general anesthesia was induced.  The patient was placed in the dorsal lithotomy position, prepped and draped in the usual sterile fashion, and preoperative antibiotics were administered. A preoperative time-out was performed.   Cystourethroscopy was performed.  The patient's urethra was examined and was normal. The bladder was then systematically examined in its entirety. There was no evidence for any  bladder tumors, stones, or other mucosal pathology.    Attention then turned to the right ureteral orifice and a ureteral catheter was used to intubate the ureteral orifice.  Omnipaque contrast was injected through the ureteral catheter and a retrograde pyelogram was performed with findings as dictated above.  A 0.38 sensor guidewire was then advanced up the right ureter into the renal pelvis under fluoroscopic guidance. The 6 Fr semirigid ureteroscope was then advanced into the ureter next to the guidewire and the calculus was identified.   The stone was then fragmented with the 365 micron holmium laser fiber on a setting of 0.6 J and frequency of 6 Hz.   All stones were then removed from the ureter with an N gauge basket.  Reinspection of the ureter revealed no remaining visible stones or fragments.   Due to his left flank pain symptom, attention then turned to the left ureteral orifice and a ureteral catheter was used to intubate the ureteral orifice.  Omnipaque contrast was injected through the ureteral catheter and a retrograde pyelogram was performed with findings as dictated above. No abnormalities were noted.  The wire was then backloaded through the cystoscope and a ureteral stent was advance over the wire using Seldinger technique.  The stent was positioned appropriately under fluoroscopic and cystoscopic guidance.  The wire was then removed with an adequate stent curl noted in the renal pelvis as well as in the bladder.  The bladder was then emptied and the procedure ended.  The patient appeared to tolerate the procedure well and without complications.  The patient was able to be awakened and transferred to the recovery unit in satisfactory condition.

## 2018-01-27 NOTE — Anesthesia Postprocedure Evaluation (Signed)
Anesthesia Post Note  Patient: Ronald JoeDale L Stretch Jr.  Procedure(s) Performed: CYSTOSCOPY/RETROGRADE/URETEROSCOPY/STONE EXTRACTION WITH BASKET (Right Ureter) HOLMIUM LASER APPLICATION (Right Ureter)     Patient location during evaluation: PACU Anesthesia Type: General Level of consciousness: awake and alert and oriented Pain management: pain level controlled Vital Signs Assessment: post-procedure vital signs reviewed and stable Respiratory status: spontaneous breathing, nonlabored ventilation and respiratory function stable Cardiovascular status: blood pressure returned to baseline and stable Postop Assessment: no apparent nausea or vomiting Anesthetic complications: no    Last Vitals:  Vitals:   01/27/18 2000 01/27/18 2015  BP: 113/87 105/64  Pulse: (!) 58 (!) 59  Resp: 13 13  Temp:    SpO2: 99% 99%    Last Pain:  Vitals:   01/27/18 1945  TempSrc:   PainSc: Asleep                 Anab Vivar A.

## 2018-01-27 NOTE — Transfer of Care (Signed)
Immediate Anesthesia Transfer of Care Note  Patient: Ronald JoeDale L Dirden Jr.  Procedure(s) Performed: CYSTOSCOPY/RETROGRADE/URETEROSCOPY/STONE EXTRACTION WITH BASKET (Right Ureter) HOLMIUM LASER APPLICATION (Right Ureter)  Patient Location: PACU  Anesthesia Type:General  Level of Consciousness: drowsy  Airway & Oxygen Therapy: Patient Spontanous Breathing and Patient connected to face mask  Post-op Assessment: Report given to RN and Patient moving all extremities  Post vital signs: Reviewed and stable  Last Vitals:  Vitals Value Taken Time  BP 109/56 01/27/2018  7:45 PM  Temp    Pulse 61 01/27/2018  7:48 PM  Resp 15 01/27/2018  7:48 PM  SpO2 94 % 01/27/2018  7:48 PM  Vitals shown include unvalidated device data.  Last Pain:  Vitals:   01/27/18 1757  TempSrc:   PainSc: 6          Complications: No apparent anesthesia complications

## 2018-01-28 ENCOUNTER — Encounter (HOSPITAL_COMMUNITY): Payer: Self-pay | Admitting: Urology

## 2018-02-11 ENCOUNTER — Telehealth: Payer: Self-pay | Admitting: Internal Medicine

## 2018-02-11 NOTE — Telephone Encounter (Signed)
Please see below messages. Will you accept this patient? Thanks, Raynelle Fanning

## 2018-02-11 NOTE — Telephone Encounter (Signed)
Pt is requesting to transfer care to see Dr.Gupta this is who his PCP is requesting he see also. Dr.Gessner will you accept this switch?

## 2018-02-11 NOTE — Telephone Encounter (Signed)
Routed to Dr. Gessner. 

## 2018-02-11 NOTE — Telephone Encounter (Signed)
OK to change to Dr. Chales Abrahams by me  Thanks

## 2018-02-12 NOTE — Telephone Encounter (Signed)
Sure Will be OK with me

## 2018-02-12 NOTE — Telephone Encounter (Signed)
Please schedule with Dr. Chales Abrahams. Thanks.

## 2018-02-12 NOTE — Telephone Encounter (Signed)
Left message for patient notifying him of this and to call back and schedule an appointment with Dr.Gupta.

## 2018-03-06 IMAGING — CT CT HEAD W/O CM
3 of 4 series · 15 of 47 positions shown, 18 images · non-contrast
Comparison: None.

CLINICAL DATA: Possible seizure.

EXAM:
CT HEAD WITHOUT CONTRAST
TECHNIQUE: Contiguous axial images were obtained from the base of the skull
through the vertex without intravenous contrast.

[Series 2: head w/o · axial · non-contrast · 0.45mm/px · z∈[-127,-7]mm · 9 of 30 slices shown, 12 images]
[im 3/30  brain]
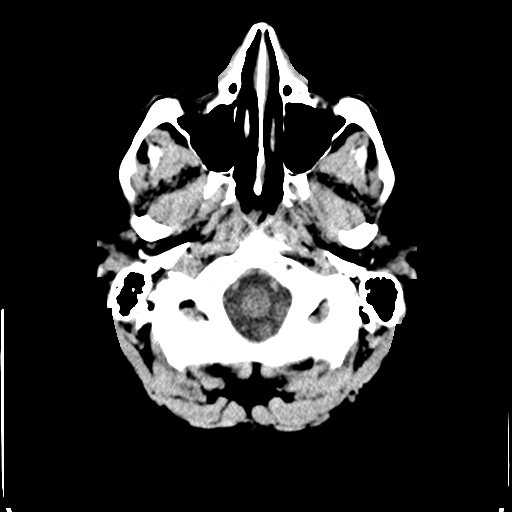
[im 3/30  bone]
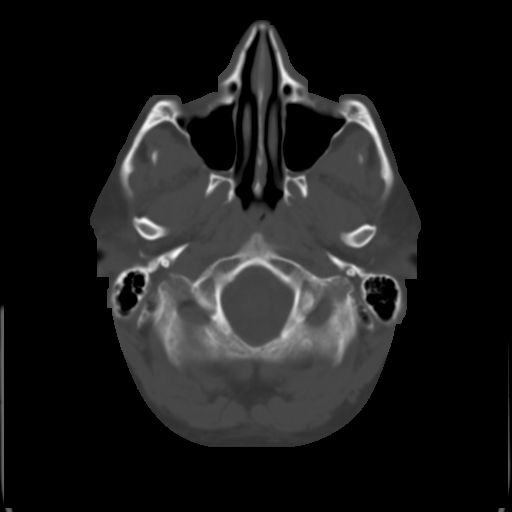
[im 6/30  brain]
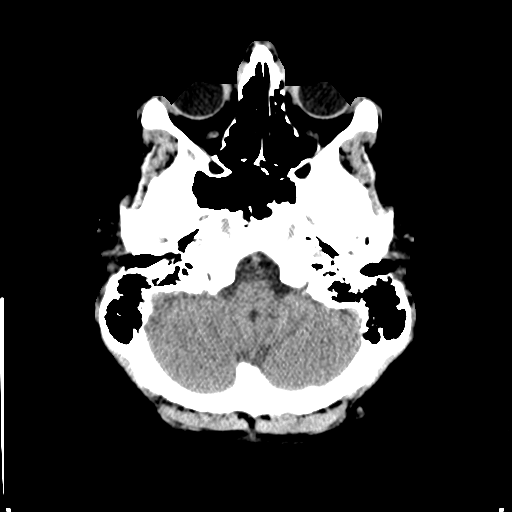
[im 9/30  brain]
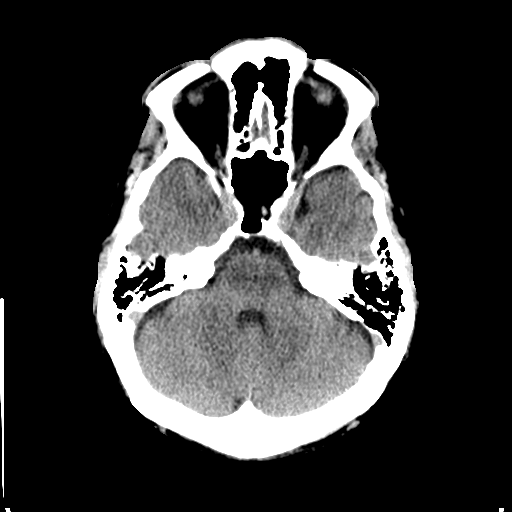
[im 12/30  brain]
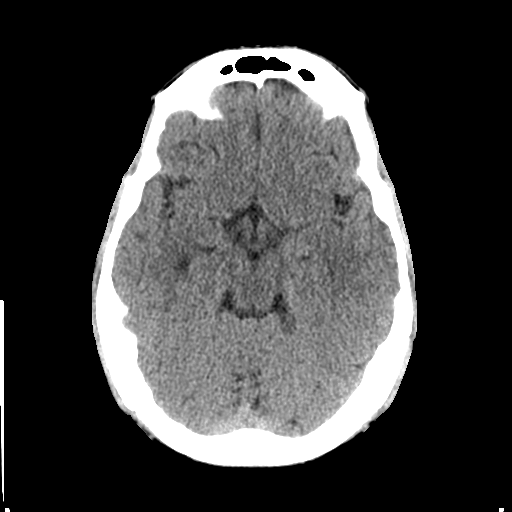
[im 15/30  brain]
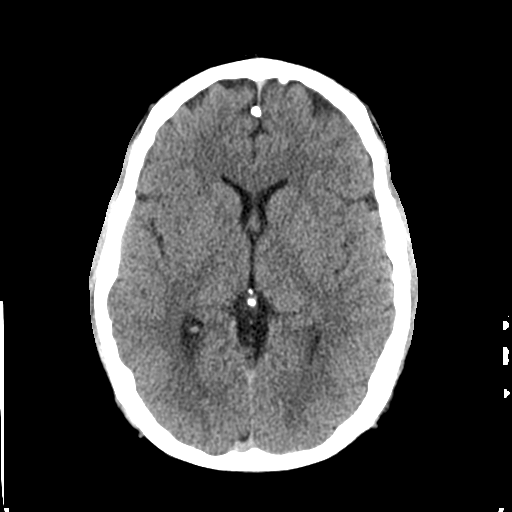
[im 15/30  bone]
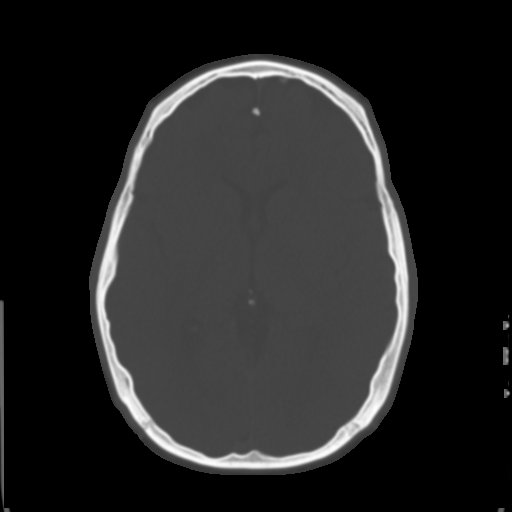
[im 18/30  brain]
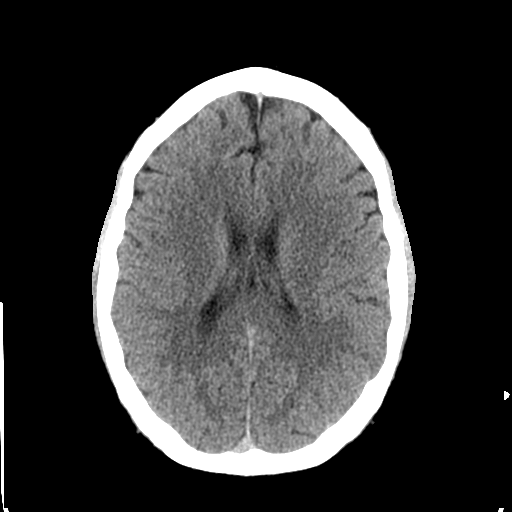
[im 21/30  brain]
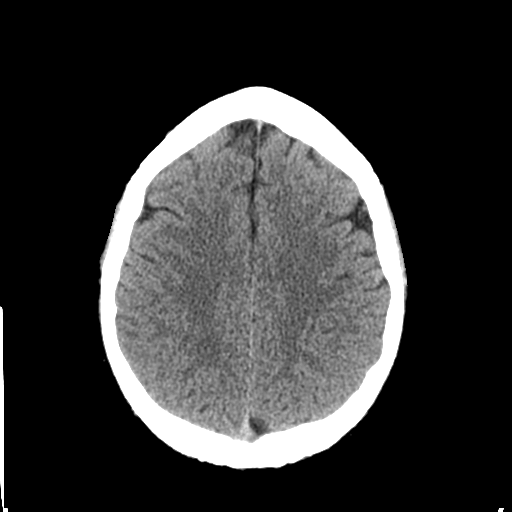
[im 24/30  brain]
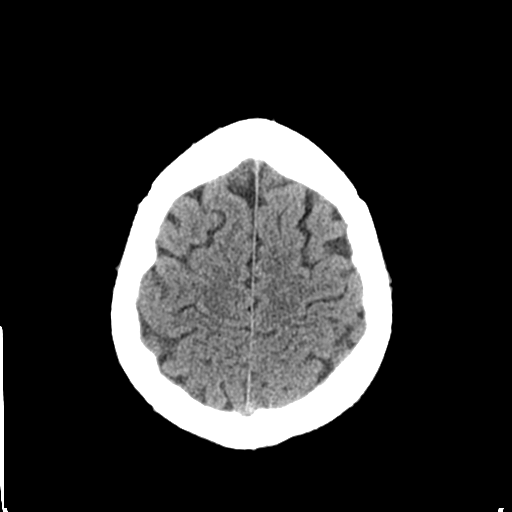
[im 27/30  brain]
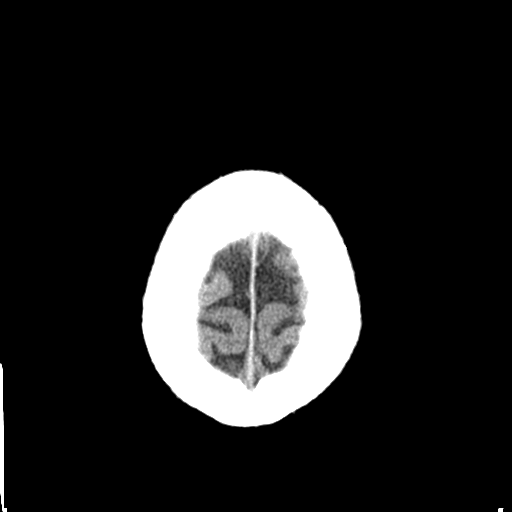
[im 27/30  bone]
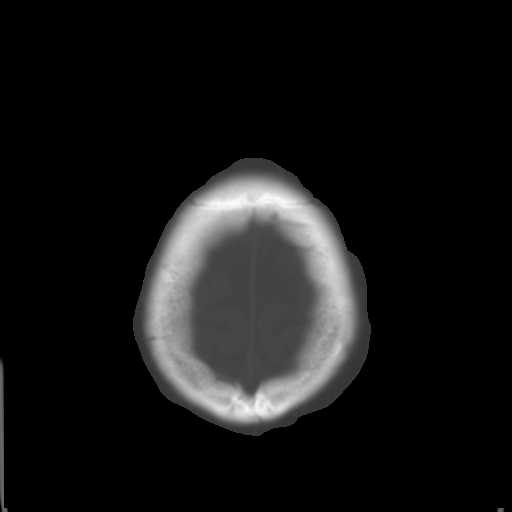

[Series 5: coronal · coronal · 0.29mm/px · 3 of 69 slices shown]
[im 23/69  brain]
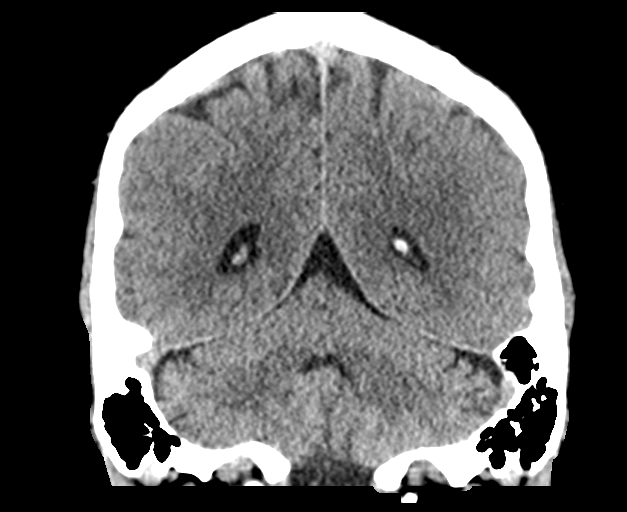
[im 31/69  brain]
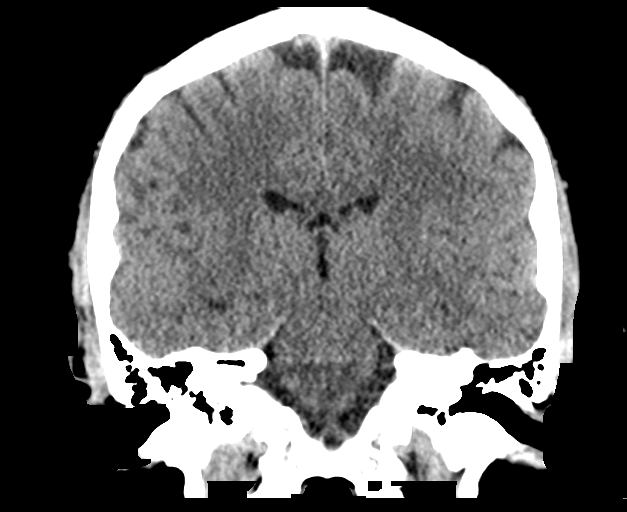
[im 38/69  brain]
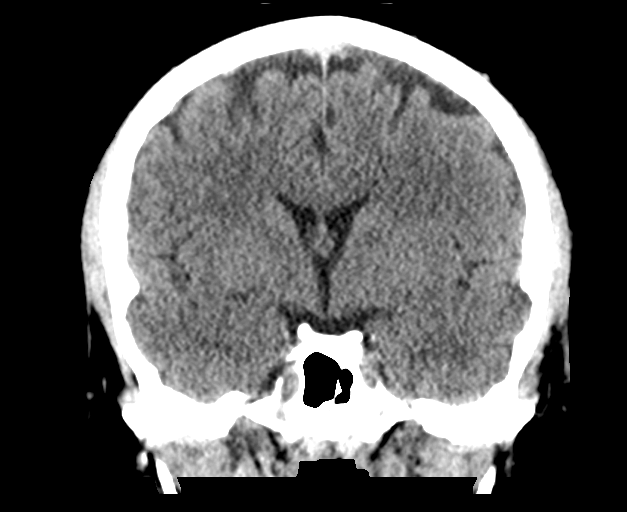

[Series 6: sagittal · sagittal · 0.30mm/px · 3 of 58 slices shown]
[im 20/58  brain]
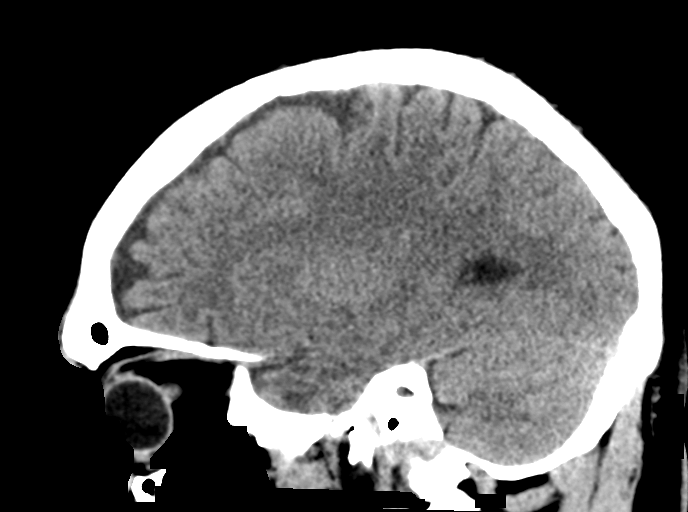
[im 29/58  brain]
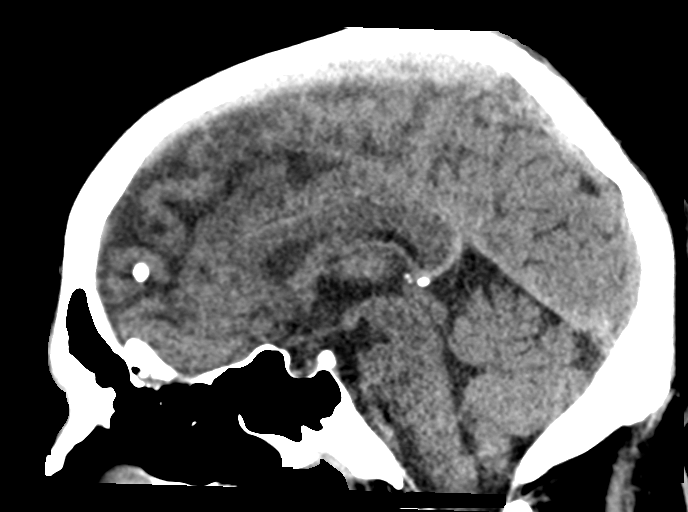
[im 39/58  brain]
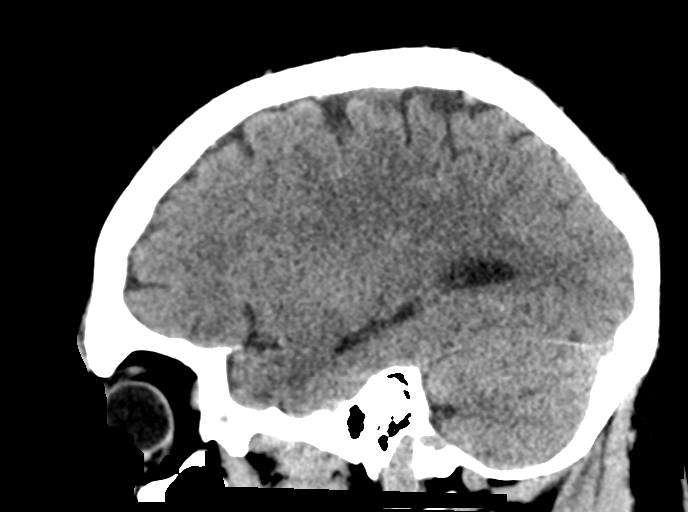

[15 of 47 positions shown; findings below may reference images not displayed]

FINDINGS: Brain: There is no evidence for acute hemorrhage, hydrocephalus,
mass lesion, or abnormal extra-axial fluid collection. No definite
CT evidence for acute infarction.

Vascular: No hyperdense vessel or unexpected calcification.

Skull: Normal. Negative for fracture or focal lesion.

Sinuses/Orbits: No acute finding.

Other: None.
IMPRESSION: Normal CT evaluation of the brain.

## 2018-03-26 NOTE — Telephone Encounter (Signed)
Patient has not returned phone call to schedule. Records will be in "records reviewed" folder. °
